# Patient Record
Sex: Male | Born: 1960 | Race: White | Hispanic: No | Marital: Married | Smoking: Former smoker
Health system: Southern US, Community
[De-identification: ages and names within clinical notes are randomized; demographics above are authoritative.]

## PROBLEM LIST (undated history)

## (undated) DIAGNOSIS — Z9221 Personal history of antineoplastic chemotherapy: Secondary | ICD-10-CM

## (undated) DIAGNOSIS — R682 Dry mouth, unspecified: Secondary | ICD-10-CM

## (undated) DIAGNOSIS — Z923 Personal history of irradiation: Secondary | ICD-10-CM

## (undated) DIAGNOSIS — K117 Disturbances of salivary secretion: Secondary | ICD-10-CM

## (undated) DIAGNOSIS — D490 Neoplasm of unspecified behavior of digestive system: Secondary | ICD-10-CM

## (undated) DIAGNOSIS — E038 Other specified hypothyroidism: Secondary | ICD-10-CM

## (undated) DIAGNOSIS — E785 Hyperlipidemia, unspecified: Secondary | ICD-10-CM

## (undated) HISTORY — DX: Other specified hypothyroidism: E03.8

## (undated) HISTORY — DX: Neoplasm of unspecified behavior of digestive system: D49.0

## (undated) HISTORY — DX: Hyperlipidemia, unspecified: E78.5

---

## 2002-03-01 ENCOUNTER — Emergency Department (HOSPITAL_COMMUNITY): Admission: EM | Admit: 2002-03-01 | Discharge: 2002-03-02 | Payer: Self-pay | Admitting: *Deleted

## 2005-02-09 ENCOUNTER — Inpatient Hospital Stay (HOSPITAL_COMMUNITY): Admission: EM | Admit: 2005-02-09 | Discharge: 2005-02-14 | Payer: Self-pay | Admitting: Emergency Medicine

## 2006-03-02 ENCOUNTER — Inpatient Hospital Stay (HOSPITAL_COMMUNITY): Admission: EM | Admit: 2006-03-02 | Discharge: 2006-03-06 | Payer: Self-pay | Admitting: Emergency Medicine

## 2006-10-29 ENCOUNTER — Ambulatory Visit (HOSPITAL_COMMUNITY): Admission: RE | Admit: 2006-10-29 | Discharge: 2006-10-30 | Payer: Self-pay | Admitting: General Surgery

## 2008-05-07 ENCOUNTER — Inpatient Hospital Stay (HOSPITAL_COMMUNITY): Admission: AD | Admit: 2008-05-07 | Discharge: 2008-05-09 | Payer: Self-pay | Admitting: Internal Medicine

## 2009-04-18 ENCOUNTER — Encounter: Admission: RE | Admit: 2009-04-18 | Discharge: 2009-04-18 | Payer: Self-pay | Admitting: Otolaryngology

## 2009-05-16 ENCOUNTER — Ambulatory Visit (HOSPITAL_COMMUNITY): Admission: RE | Admit: 2009-05-16 | Discharge: 2009-05-16 | Payer: Self-pay | Admitting: Otolaryngology

## 2009-05-16 ENCOUNTER — Encounter (INDEPENDENT_AMBULATORY_CARE_PROVIDER_SITE_OTHER): Payer: Self-pay | Admitting: Otolaryngology

## 2009-05-24 ENCOUNTER — Ambulatory Visit (HOSPITAL_COMMUNITY): Admission: RE | Admit: 2009-05-24 | Discharge: 2009-05-24 | Payer: Self-pay | Admitting: Otolaryngology

## 2009-05-29 ENCOUNTER — Ambulatory Visit: Payer: Self-pay | Admitting: Oncology

## 2009-06-07 LAB — COMPREHENSIVE METABOLIC PANEL
ALT: 76 U/L — ABNORMAL HIGH (ref 0–53)
AST: 34 U/L (ref 0–37)
Albumin: 4.6 g/dL (ref 3.5–5.2)
Alkaline Phosphatase: 81 U/L (ref 39–117)
BUN: 11 mg/dL (ref 6–23)
CO2: 28 mEq/L (ref 19–32)
Calcium: 9.8 mg/dL (ref 8.4–10.5)
Chloride: 103 mEq/L (ref 96–112)
Creatinine, Ser: 1.03 mg/dL (ref 0.40–1.50)
Glucose, Bld: 91 mg/dL (ref 70–99)
Potassium: 4.8 mEq/L (ref 3.5–5.3)
Sodium: 139 mEq/L (ref 135–145)
Total Bilirubin: 0.7 mg/dL (ref 0.3–1.2)
Total Protein: 7.2 g/dL (ref 6.0–8.3)

## 2009-06-07 LAB — CBC WITH DIFFERENTIAL/PLATELET
BASO%: 0.5 % (ref 0.0–2.0)
Basophils Absolute: 0 10*3/uL (ref 0.0–0.1)
EOS%: 2.2 % (ref 0.0–7.0)
Eosinophils Absolute: 0.2 10*3/uL (ref 0.0–0.5)
HCT: 47.1 % (ref 38.4–49.9)
HGB: 15.9 g/dL (ref 13.0–17.1)
LYMPH%: 31.1 % (ref 14.0–49.0)
MCH: 32.5 pg (ref 27.2–33.4)
MCHC: 33.8 g/dL (ref 32.0–36.0)
MCV: 96.2 fL (ref 79.3–98.0)
MONO#: 0.7 10*3/uL (ref 0.1–0.9)
MONO%: 7.6 % (ref 0.0–14.0)
NEUT#: 5.1 10*3/uL (ref 1.5–6.5)
NEUT%: 58.6 % (ref 39.0–75.0)
Platelets: 236 10*3/uL (ref 140–400)
RBC: 4.89 10*6/uL (ref 4.20–5.82)
RDW: 13.1 % (ref 11.0–14.6)
WBC: 8.6 10*3/uL (ref 4.0–10.3)
lymph#: 2.7 10*3/uL (ref 0.9–3.3)

## 2009-06-11 ENCOUNTER — Ambulatory Visit: Admission: RE | Admit: 2009-06-11 | Discharge: 2009-07-12 | Payer: Self-pay | Admitting: Radiation Oncology

## 2009-06-11 ENCOUNTER — Ambulatory Visit: Payer: Self-pay | Admitting: Dentistry

## 2009-06-11 ENCOUNTER — Encounter: Admission: AD | Admit: 2009-06-11 | Discharge: 2009-06-11 | Payer: Self-pay | Admitting: Dentistry

## 2009-06-13 ENCOUNTER — Ambulatory Visit: Payer: Self-pay | Admitting: Dentistry

## 2009-06-25 ENCOUNTER — Ambulatory Visit (HOSPITAL_COMMUNITY): Admission: RE | Admit: 2009-06-25 | Discharge: 2009-06-25 | Payer: Self-pay | Admitting: Radiation Oncology

## 2009-07-04 ENCOUNTER — Ambulatory Visit (HOSPITAL_COMMUNITY): Admission: RE | Admit: 2009-07-04 | Discharge: 2009-07-04 | Payer: Self-pay | Admitting: Oncology

## 2009-07-10 ENCOUNTER — Ambulatory Visit: Payer: Self-pay | Admitting: Oncology

## 2009-07-15 ENCOUNTER — Ambulatory Visit: Admission: RE | Admit: 2009-07-15 | Discharge: 2009-09-04 | Payer: Self-pay | Admitting: Radiation Oncology

## 2009-07-15 LAB — CBC WITH DIFFERENTIAL/PLATELET
BASO%: 0.7 % (ref 0.0–2.0)
Basophils Absolute: 0.1 10*3/uL (ref 0.0–0.1)
EOS%: 3.1 % (ref 0.0–7.0)
Eosinophils Absolute: 0.2 10*3/uL (ref 0.0–0.5)
HCT: 42.8 % (ref 38.4–49.9)
HGB: 14.9 g/dL (ref 13.0–17.1)
LYMPH%: 33 % (ref 14.0–49.0)
MCH: 32 pg (ref 27.2–33.4)
MCHC: 34.8 g/dL (ref 32.0–36.0)
MCV: 92 fL (ref 79.3–98.0)
MONO#: 0.7 10*3/uL (ref 0.1–0.9)
MONO%: 9.7 % (ref 0.0–14.0)
NEUT#: 3.8 10*3/uL (ref 1.5–6.5)
NEUT%: 53.5 % (ref 39.0–75.0)
Platelets: 222 10*3/uL (ref 140–400)
RBC: 4.65 10*6/uL (ref 4.20–5.82)
RDW: 12.5 % (ref 11.0–14.6)
WBC: 7.1 10*3/uL (ref 4.0–10.3)
lymph#: 2.4 10*3/uL (ref 0.9–3.3)

## 2009-07-15 LAB — COMPREHENSIVE METABOLIC PANEL
ALT: 79 U/L — ABNORMAL HIGH (ref 0–53)
AST: 47 U/L — ABNORMAL HIGH (ref 0–37)
Albumin: 3.7 g/dL (ref 3.5–5.2)
Alkaline Phosphatase: 83 U/L (ref 39–117)
BUN: 11 mg/dL (ref 6–23)
CO2: 29 mEq/L (ref 19–32)
Calcium: 9.1 mg/dL (ref 8.4–10.5)
Chloride: 102 mEq/L (ref 96–112)
Creatinine, Ser: 0.98 mg/dL (ref 0.40–1.50)
Glucose, Bld: 94 mg/dL (ref 70–99)
Potassium: 4.1 mEq/L (ref 3.5–5.3)
Sodium: 136 mEq/L (ref 135–145)
Total Bilirubin: 1 mg/dL (ref 0.3–1.2)
Total Protein: 6.8 g/dL (ref 6.0–8.3)

## 2009-07-22 ENCOUNTER — Encounter: Admission: RE | Admit: 2009-07-22 | Discharge: 2009-10-20 | Payer: Self-pay | Admitting: Radiation Oncology

## 2009-07-22 LAB — BASIC METABOLIC PANEL
BUN: 14 mg/dL (ref 6–23)
CO2: 26 mEq/L (ref 19–32)
Calcium: 9.4 mg/dL (ref 8.4–10.5)
Chloride: 99 mEq/L (ref 96–112)
Creatinine, Ser: 1.15 mg/dL (ref 0.40–1.50)
Glucose, Bld: 90 mg/dL (ref 70–99)
Potassium: 4.8 mEq/L (ref 3.5–5.3)
Sodium: 133 mEq/L — ABNORMAL LOW (ref 135–145)

## 2009-07-29 LAB — CBC WITH DIFFERENTIAL/PLATELET
BASO%: 0.4 % (ref 0.0–2.0)
Basophils Absolute: 0 10*3/uL (ref 0.0–0.1)
EOS%: 2 % (ref 0.0–7.0)
Eosinophils Absolute: 0.2 10*3/uL (ref 0.0–0.5)
HCT: 42.3 % (ref 38.4–49.9)
HGB: 14.7 g/dL (ref 13.0–17.1)
LYMPH%: 12.1 % — ABNORMAL LOW (ref 14.0–49.0)
MCH: 32 pg (ref 27.2–33.4)
MCHC: 34.8 g/dL (ref 32.0–36.0)
MCV: 92 fL (ref 79.3–98.0)
MONO#: 0.8 10*3/uL (ref 0.1–0.9)
MONO%: 10.3 % (ref 0.0–14.0)
NEUT#: 6.1 10*3/uL (ref 1.5–6.5)
NEUT%: 75.2 % — ABNORMAL HIGH (ref 39.0–75.0)
Platelets: 172 10*3/uL (ref 140–400)
RBC: 4.6 10*6/uL (ref 4.20–5.82)
RDW: 12.4 % (ref 11.0–14.6)
WBC: 8.1 10*3/uL (ref 4.0–10.3)
lymph#: 1 10*3/uL (ref 0.9–3.3)
nRBC: 0 % (ref 0–0)

## 2009-07-29 LAB — BASIC METABOLIC PANEL
BUN: 16 mg/dL (ref 6–23)
CO2: 24 mEq/L (ref 19–32)
Calcium: 9 mg/dL (ref 8.4–10.5)
Chloride: 102 mEq/L (ref 96–112)
Creatinine, Ser: 0.93 mg/dL (ref 0.40–1.50)
Glucose, Bld: 78 mg/dL (ref 70–99)
Potassium: 4.4 mEq/L (ref 3.5–5.3)
Sodium: 136 mEq/L (ref 135–145)

## 2009-08-05 ENCOUNTER — Emergency Department (HOSPITAL_COMMUNITY): Admission: EM | Admit: 2009-08-05 | Discharge: 2009-08-05 | Payer: Self-pay | Admitting: Emergency Medicine

## 2009-08-05 LAB — COMPREHENSIVE METABOLIC PANEL
ALT: 36 U/L (ref 0–53)
AST: 25 U/L (ref 0–37)
Albumin: 3.4 g/dL — ABNORMAL LOW (ref 3.5–5.2)
Alkaline Phosphatase: 100 U/L (ref 39–117)
BUN: 12 mg/dL (ref 6–23)
CO2: 28 mEq/L (ref 19–32)
Calcium: 8.9 mg/dL (ref 8.4–10.5)
Chloride: 98 mEq/L (ref 96–112)
Creatinine, Ser: 1.04 mg/dL (ref 0.40–1.50)
Glucose, Bld: 96 mg/dL (ref 70–99)
Potassium: 4.2 mEq/L (ref 3.5–5.3)
Sodium: 133 mEq/L — ABNORMAL LOW (ref 135–145)
Total Bilirubin: 0.6 mg/dL (ref 0.3–1.2)
Total Protein: 7 g/dL (ref 6.0–8.3)

## 2009-08-05 LAB — CBC WITH DIFFERENTIAL/PLATELET
BASO%: 1.3 % (ref 0.0–2.0)
Basophils Absolute: 0 10*3/uL (ref 0.0–0.1)
EOS%: 2.5 % (ref 0.0–7.0)
Eosinophils Absolute: 0.1 10*3/uL (ref 0.0–0.5)
HCT: 38.3 % — ABNORMAL LOW (ref 38.4–49.9)
HGB: 13 g/dL (ref 13.0–17.1)
LYMPH%: 15.9 % (ref 14.0–49.0)
MCH: 31.6 pg (ref 27.2–33.4)
MCHC: 33.9 g/dL (ref 32.0–36.0)
MCV: 93.2 fL (ref 79.3–98.0)
MONO#: 1.1 10*3/uL — ABNORMAL HIGH (ref 0.1–0.9)
MONO%: 36.2 % — ABNORMAL HIGH (ref 0.0–14.0)
NEUT#: 1.4 10*3/uL — ABNORMAL LOW (ref 1.5–6.5)
NEUT%: 44.1 % (ref 39.0–75.0)
Platelets: 236 10*3/uL (ref 140–400)
RBC: 4.11 10*6/uL — ABNORMAL LOW (ref 4.20–5.82)
RDW: 12.5 % (ref 11.0–14.6)
WBC: 3.2 10*3/uL — ABNORMAL LOW (ref 4.0–10.3)
lymph#: 0.5 10*3/uL — ABNORMAL LOW (ref 0.9–3.3)

## 2009-08-05 LAB — MAGNESIUM: Magnesium: 2 mg/dL (ref 1.5–2.5)

## 2009-08-06 ENCOUNTER — Ambulatory Visit: Payer: Self-pay | Admitting: Oncology

## 2009-08-07 ENCOUNTER — Ambulatory Visit: Payer: Self-pay | Admitting: Dentistry

## 2009-08-08 LAB — COMPREHENSIVE METABOLIC PANEL
ALT: 45 U/L (ref 0–53)
AST: 31 U/L (ref 0–37)
Albumin: 3.3 g/dL — ABNORMAL LOW (ref 3.5–5.2)
Alkaline Phosphatase: 104 U/L (ref 39–117)
BUN: 13 mg/dL (ref 6–23)
CO2: 29 mEq/L (ref 19–32)
Calcium: 9.3 mg/dL (ref 8.4–10.5)
Chloride: 99 mEq/L (ref 96–112)
Creatinine, Ser: 0.97 mg/dL (ref 0.40–1.50)
Glucose, Bld: 104 mg/dL — ABNORMAL HIGH (ref 70–99)
Potassium: 4.3 mEq/L (ref 3.5–5.3)
Sodium: 135 mEq/L (ref 135–145)
Total Bilirubin: 0.4 mg/dL (ref 0.3–1.2)
Total Protein: 7.1 g/dL (ref 6.0–8.3)

## 2009-08-08 LAB — CBC WITH DIFFERENTIAL/PLATELET
BASO%: 1.5 % (ref 0.0–2.0)
Basophils Absolute: 0.1 10*3/uL (ref 0.0–0.1)
EOS%: 5.2 % (ref 0.0–7.0)
Eosinophils Absolute: 0.2 10*3/uL (ref 0.0–0.5)
HCT: 39.8 % (ref 38.4–49.9)
HGB: 13.5 g/dL (ref 13.0–17.1)
LYMPH%: 17.6 % (ref 14.0–49.0)
MCH: 31.7 pg (ref 27.2–33.4)
MCHC: 33.9 g/dL (ref 32.0–36.0)
MCV: 93.4 fL (ref 79.3–98.0)
MONO#: 0.9 10*3/uL (ref 0.1–0.9)
MONO%: 28.1 % — ABNORMAL HIGH (ref 0.0–14.0)
NEUT#: 1.5 10*3/uL (ref 1.5–6.5)
NEUT%: 47.6 % (ref 39.0–75.0)
Platelets: 232 10*3/uL (ref 140–400)
RBC: 4.26 10*6/uL (ref 4.20–5.82)
RDW: 12.4 % (ref 11.0–14.6)
WBC: 3.2 10*3/uL — ABNORMAL LOW (ref 4.0–10.3)
lymph#: 0.6 10*3/uL — ABNORMAL LOW (ref 0.9–3.3)
nRBC: 0 % (ref 0–0)

## 2009-08-15 LAB — COMPREHENSIVE METABOLIC PANEL
ALT: 71 U/L — ABNORMAL HIGH (ref 0–53)
AST: 38 U/L — ABNORMAL HIGH (ref 0–37)
Albumin: 3.2 g/dL — ABNORMAL LOW (ref 3.5–5.2)
Alkaline Phosphatase: 95 U/L (ref 39–117)
BUN: 20 mg/dL (ref 6–23)
CO2: 29 mEq/L (ref 19–32)
Calcium: 8.6 mg/dL (ref 8.4–10.5)
Chloride: 100 mEq/L (ref 96–112)
Creatinine, Ser: 1.19 mg/dL (ref 0.40–1.50)
Glucose, Bld: 82 mg/dL (ref 70–99)
Potassium: 5 mEq/L (ref 3.5–5.3)
Sodium: 133 mEq/L — ABNORMAL LOW (ref 135–145)
Total Bilirubin: 1 mg/dL (ref 0.3–1.2)
Total Protein: 6.1 g/dL (ref 6.0–8.3)

## 2009-08-15 LAB — CBC WITH DIFFERENTIAL/PLATELET
BASO%: 2.4 % — ABNORMAL HIGH (ref 0.0–2.0)
Basophils Absolute: 0.1 10*3/uL (ref 0.0–0.1)
EOS%: 0.6 % (ref 0.0–7.0)
Eosinophils Absolute: 0 10*3/uL (ref 0.0–0.5)
HCT: 39.4 % (ref 38.4–49.9)
HGB: 13.7 g/dL (ref 13.0–17.1)
LYMPH%: 11.9 % — ABNORMAL LOW (ref 14.0–49.0)
MCH: 31.7 pg (ref 27.2–33.4)
MCHC: 34.8 g/dL (ref 32.0–36.0)
MCV: 91.2 fL (ref 79.3–98.0)
MONO#: 0.4 10*3/uL (ref 0.1–0.9)
MONO%: 12.2 % (ref 0.0–14.0)
NEUT#: 2.4 10*3/uL (ref 1.5–6.5)
NEUT%: 72.9 % (ref 39.0–75.0)
Platelets: 136 10*3/uL — ABNORMAL LOW (ref 140–400)
RBC: 4.32 10*6/uL (ref 4.20–5.82)
RDW: 12 % (ref 11.0–14.6)
WBC: 3.3 10*3/uL — ABNORMAL LOW (ref 4.0–10.3)
lymph#: 0.4 10*3/uL — ABNORMAL LOW (ref 0.9–3.3)
nRBC: 0 % (ref 0–0)

## 2009-08-22 LAB — CBC WITH DIFFERENTIAL/PLATELET
BASO%: 0.7 % (ref 0.0–2.0)
Basophils Absolute: 0 10*3/uL (ref 0.0–0.1)
EOS%: 1.6 % (ref 0.0–7.0)
Eosinophils Absolute: 0.1 10*3/uL (ref 0.0–0.5)
HCT: 36.7 % — ABNORMAL LOW (ref 38.4–49.9)
HGB: 12.5 g/dL — ABNORMAL LOW (ref 13.0–17.1)
LYMPH%: 14.5 % (ref 14.0–49.0)
MCH: 31.4 pg (ref 27.2–33.4)
MCHC: 34.1 g/dL (ref 32.0–36.0)
MCV: 92.2 fL (ref 79.3–98.0)
MONO#: 0.4 10*3/uL (ref 0.1–0.9)
MONO%: 11.5 % (ref 0.0–14.0)
NEUT#: 2.2 10*3/uL (ref 1.5–6.5)
NEUT%: 71.7 % (ref 39.0–75.0)
Platelets: 128 10*3/uL — ABNORMAL LOW (ref 140–400)
RBC: 3.98 10*6/uL — ABNORMAL LOW (ref 4.20–5.82)
RDW: 12.1 % (ref 11.0–14.6)
WBC: 3 10*3/uL — ABNORMAL LOW (ref 4.0–10.3)
lymph#: 0.4 10*3/uL — ABNORMAL LOW (ref 0.9–3.3)

## 2009-08-22 LAB — COMPREHENSIVE METABOLIC PANEL
ALT: 21 U/L (ref 0–53)
AST: 18 U/L (ref 0–37)
Albumin: 3.8 g/dL (ref 3.5–5.2)
Alkaline Phosphatase: 87 U/L (ref 39–117)
BUN: 20 mg/dL (ref 6–23)
CO2: 27 mEq/L (ref 19–32)
Calcium: 8.7 mg/dL (ref 8.4–10.5)
Chloride: 100 mEq/L (ref 96–112)
Creatinine, Ser: 0.99 mg/dL (ref 0.40–1.50)
Glucose, Bld: 107 mg/dL — ABNORMAL HIGH (ref 70–99)
Potassium: 4.6 mEq/L (ref 3.5–5.3)
Sodium: 134 mEq/L — ABNORMAL LOW (ref 135–145)
Total Bilirubin: 0.4 mg/dL (ref 0.3–1.2)
Total Protein: 6.4 g/dL (ref 6.0–8.3)

## 2009-08-29 LAB — CBC WITH DIFFERENTIAL/PLATELET
BASO%: 0.9 % (ref 0.0–2.0)
Basophils Absolute: 0 10*3/uL (ref 0.0–0.1)
EOS%: 4.1 % (ref 0.0–7.0)
Eosinophils Absolute: 0.1 10*3/uL (ref 0.0–0.5)
HCT: 31.8 % — ABNORMAL LOW (ref 38.4–49.9)
HGB: 10.5 g/dL — ABNORMAL LOW (ref 13.0–17.1)
LYMPH%: 21.5 % (ref 14.0–49.0)
MCH: 30.9 pg (ref 27.2–33.4)
MCHC: 33 g/dL (ref 32.0–36.0)
MCV: 93.5 fL (ref 79.3–98.0)
MONO#: 1 10*3/uL — ABNORMAL HIGH (ref 0.1–0.9)
MONO%: 47 % — ABNORMAL HIGH (ref 0.0–14.0)
NEUT#: 0.6 10*3/uL — ABNORMAL LOW (ref 1.5–6.5)
NEUT%: 26.5 % — ABNORMAL LOW (ref 39.0–75.0)
Platelets: 315 10*3/uL (ref 140–400)
RBC: 3.4 10*6/uL — ABNORMAL LOW (ref 4.20–5.82)
RDW: 12.9 % (ref 11.0–14.6)
WBC: 2.2 10*3/uL — ABNORMAL LOW (ref 4.0–10.3)
lymph#: 0.5 10*3/uL — ABNORMAL LOW (ref 0.9–3.3)

## 2009-09-26 ENCOUNTER — Ambulatory Visit: Payer: Self-pay | Admitting: Oncology

## 2009-09-27 ENCOUNTER — Ambulatory Visit (HOSPITAL_COMMUNITY): Admission: RE | Admit: 2009-09-27 | Discharge: 2009-09-27 | Payer: Self-pay | Admitting: Oncology

## 2009-09-30 ENCOUNTER — Ambulatory Visit: Payer: Self-pay | Admitting: Oncology

## 2009-09-30 LAB — COMPREHENSIVE METABOLIC PANEL
ALT: 27 U/L (ref 0–53)
AST: 20 U/L (ref 0–37)
Albumin: 3.9 g/dL (ref 3.5–5.2)
Alkaline Phosphatase: 75 U/L (ref 39–117)
BUN: 10 mg/dL (ref 6–23)
CO2: 29 mEq/L (ref 19–32)
Calcium: 9.7 mg/dL (ref 8.4–10.5)
Chloride: 101 mEq/L (ref 96–112)
Creatinine, Ser: 1.03 mg/dL (ref 0.40–1.50)
Glucose, Bld: 82 mg/dL (ref 70–99)
Potassium: 4.5 mEq/L (ref 3.5–5.3)
Sodium: 138 mEq/L (ref 135–145)
Total Bilirubin: 0.5 mg/dL (ref 0.3–1.2)
Total Protein: 6.4 g/dL (ref 6.0–8.3)

## 2009-09-30 LAB — CBC WITH DIFFERENTIAL/PLATELET
BASO%: 0.7 % (ref 0.0–2.0)
Basophils Absolute: 0 10*3/uL (ref 0.0–0.1)
EOS%: 2 % (ref 0.0–7.0)
Eosinophils Absolute: 0.1 10*3/uL (ref 0.0–0.5)
HCT: 34.8 % — ABNORMAL LOW (ref 38.4–49.9)
HGB: 12.1 g/dL — ABNORMAL LOW (ref 13.0–17.1)
LYMPH%: 6.8 % — ABNORMAL LOW (ref 14.0–49.0)
MCH: 33.5 pg — ABNORMAL HIGH (ref 27.2–33.4)
MCHC: 34.9 g/dL (ref 32.0–36.0)
MCV: 96.3 fL (ref 79.3–98.0)
MONO#: 0.8 10*3/uL (ref 0.1–0.9)
MONO%: 13.4 % (ref 0.0–14.0)
NEUT#: 4.6 10*3/uL (ref 1.5–6.5)
NEUT%: 77.1 % — ABNORMAL HIGH (ref 39.0–75.0)
Platelets: 248 10*3/uL (ref 140–400)
RBC: 3.61 10*6/uL — ABNORMAL LOW (ref 4.20–5.82)
RDW: 15 % — ABNORMAL HIGH (ref 11.0–14.6)
WBC: 6 10*3/uL (ref 4.0–10.3)
lymph#: 0.4 10*3/uL — ABNORMAL LOW (ref 0.9–3.3)

## 2009-10-02 ENCOUNTER — Ambulatory Visit: Payer: Self-pay | Admitting: Dentistry

## 2009-11-06 ENCOUNTER — Ambulatory Visit: Payer: Self-pay | Admitting: Oncology

## 2009-11-07 ENCOUNTER — Encounter: Admission: RE | Admit: 2009-11-07 | Discharge: 2010-02-05 | Payer: Self-pay | Admitting: Radiation Oncology

## 2009-12-03 ENCOUNTER — Ambulatory Visit (HOSPITAL_COMMUNITY): Admission: RE | Admit: 2009-12-03 | Discharge: 2009-12-03 | Payer: Self-pay | Admitting: Oncology

## 2009-12-03 LAB — COMPREHENSIVE METABOLIC PANEL
ALT: 20 U/L (ref 0–53)
AST: 17 U/L (ref 0–37)
Albumin: 4.2 g/dL (ref 3.5–5.2)
Alkaline Phosphatase: 65 U/L (ref 39–117)
BUN: 11 mg/dL (ref 6–23)
CO2: 22 mEq/L (ref 19–32)
Calcium: 9.5 mg/dL (ref 8.4–10.5)
Chloride: 104 mEq/L (ref 96–112)
Creatinine, Ser: 0.89 mg/dL (ref 0.40–1.50)
Glucose, Bld: 91 mg/dL (ref 70–99)
Potassium: 4.3 mEq/L (ref 3.5–5.3)
Sodium: 137 mEq/L (ref 135–145)
Total Bilirubin: 0.4 mg/dL (ref 0.3–1.2)
Total Protein: 6.9 g/dL (ref 6.0–8.3)

## 2009-12-03 LAB — CBC WITH DIFFERENTIAL/PLATELET
BASO%: 0.4 % (ref 0.0–2.0)
Basophils Absolute: 0 10*3/uL (ref 0.0–0.1)
EOS%: 1.6 % (ref 0.0–7.0)
Eosinophils Absolute: 0.1 10*3/uL (ref 0.0–0.5)
HCT: 38.8 % (ref 38.4–49.9)
HGB: 13.5 g/dL (ref 13.0–17.1)
LYMPH%: 13.2 % — ABNORMAL LOW (ref 14.0–49.0)
MCH: 33.2 pg (ref 27.2–33.4)
MCHC: 34.7 g/dL (ref 32.0–36.0)
MCV: 95.6 fL (ref 79.3–98.0)
MONO#: 0.4 10*3/uL (ref 0.1–0.9)
MONO%: 9.1 % (ref 0.0–14.0)
NEUT#: 3.1 10*3/uL (ref 1.5–6.5)
NEUT%: 75.7 % — ABNORMAL HIGH (ref 39.0–75.0)
Platelets: 210 10*3/uL (ref 140–400)
RBC: 4.06 10*6/uL — ABNORMAL LOW (ref 4.20–5.82)
RDW: 12.8 % (ref 11.0–14.6)
WBC: 4.1 10*3/uL (ref 4.0–10.3)
lymph#: 0.5 10*3/uL — ABNORMAL LOW (ref 0.9–3.3)

## 2009-12-03 LAB — TSH: TSH: 1.707 u[IU]/mL (ref 0.350–4.500)

## 2009-12-13 ENCOUNTER — Ambulatory Visit (HOSPITAL_COMMUNITY): Admission: RE | Admit: 2009-12-13 | Discharge: 2009-12-13 | Payer: Self-pay | Admitting: Oncology

## 2010-04-16 ENCOUNTER — Ambulatory Visit (HOSPITAL_COMMUNITY): Admission: RE | Admit: 2010-04-16 | Discharge: 2010-04-16 | Payer: Self-pay | Admitting: Oncology

## 2010-04-17 ENCOUNTER — Ambulatory Visit: Payer: Self-pay | Admitting: Oncology

## 2010-04-21 LAB — CBC WITH DIFFERENTIAL/PLATELET
BASO%: 0.1 % (ref 0.0–2.0)
Basophils Absolute: 0 10*3/uL (ref 0.0–0.1)
EOS%: 1.4 % (ref 0.0–7.0)
Eosinophils Absolute: 0.1 10*3/uL (ref 0.0–0.5)
HCT: 41.9 % (ref 38.4–49.9)
HGB: 14.5 g/dL (ref 13.0–17.1)
LYMPH%: 11.3 % — ABNORMAL LOW (ref 14.0–49.0)
MCH: 33.2 pg (ref 27.2–33.4)
MCHC: 34.6 g/dL (ref 32.0–36.0)
MCV: 95.8 fL (ref 79.3–98.0)
MONO#: 0.5 10*3/uL (ref 0.1–0.9)
MONO%: 8.3 % (ref 0.0–14.0)
NEUT#: 4.3 10*3/uL (ref 1.5–6.5)
NEUT%: 78.9 % — ABNORMAL HIGH (ref 39.0–75.0)
Platelets: 224 10*3/uL (ref 140–400)
RBC: 4.38 10*6/uL (ref 4.20–5.82)
RDW: 13.6 % (ref 11.0–14.6)
WBC: 5.5 10*3/uL (ref 4.0–10.3)
lymph#: 0.6 10*3/uL — ABNORMAL LOW (ref 0.9–3.3)

## 2010-04-21 LAB — COMPREHENSIVE METABOLIC PANEL
ALT: 16 U/L (ref 0–53)
AST: 18 U/L (ref 0–37)
Albumin: 4.4 g/dL (ref 3.5–5.2)
Alkaline Phosphatase: 65 U/L (ref 39–117)
BUN: 8 mg/dL (ref 6–23)
CO2: 22 mEq/L (ref 19–32)
Calcium: 9.6 mg/dL (ref 8.4–10.5)
Chloride: 105 mEq/L (ref 96–112)
Creatinine, Ser: 1.04 mg/dL (ref 0.40–1.50)
Glucose, Bld: 85 mg/dL (ref 70–99)
Potassium: 3.9 mEq/L (ref 3.5–5.3)
Sodium: 142 mEq/L (ref 135–145)
Total Bilirubin: 0.9 mg/dL (ref 0.3–1.2)
Total Protein: 6.9 g/dL (ref 6.0–8.3)

## 2010-04-21 LAB — TSH: TSH: 14.335 u[IU]/mL — ABNORMAL HIGH (ref 0.350–4.500)

## 2010-04-24 LAB — T4, FREE: Free T4: 0.76 ng/dL — ABNORMAL LOW (ref 0.80–1.80)

## 2010-08-01 ENCOUNTER — Other Ambulatory Visit: Payer: Self-pay | Admitting: Oncology

## 2010-08-01 DIAGNOSIS — C099 Malignant neoplasm of tonsil, unspecified: Secondary | ICD-10-CM

## 2010-08-01 DIAGNOSIS — C029 Malignant neoplasm of tongue, unspecified: Secondary | ICD-10-CM

## 2010-08-28 IMAGING — CR DG CHEST 2V
2 series · 2 of 2 positions shown · non-contrast
Comparison: None available.

CLINICAL DATA: Cough and congestion.

CHEST - 2 VIEW

[w chest pa]
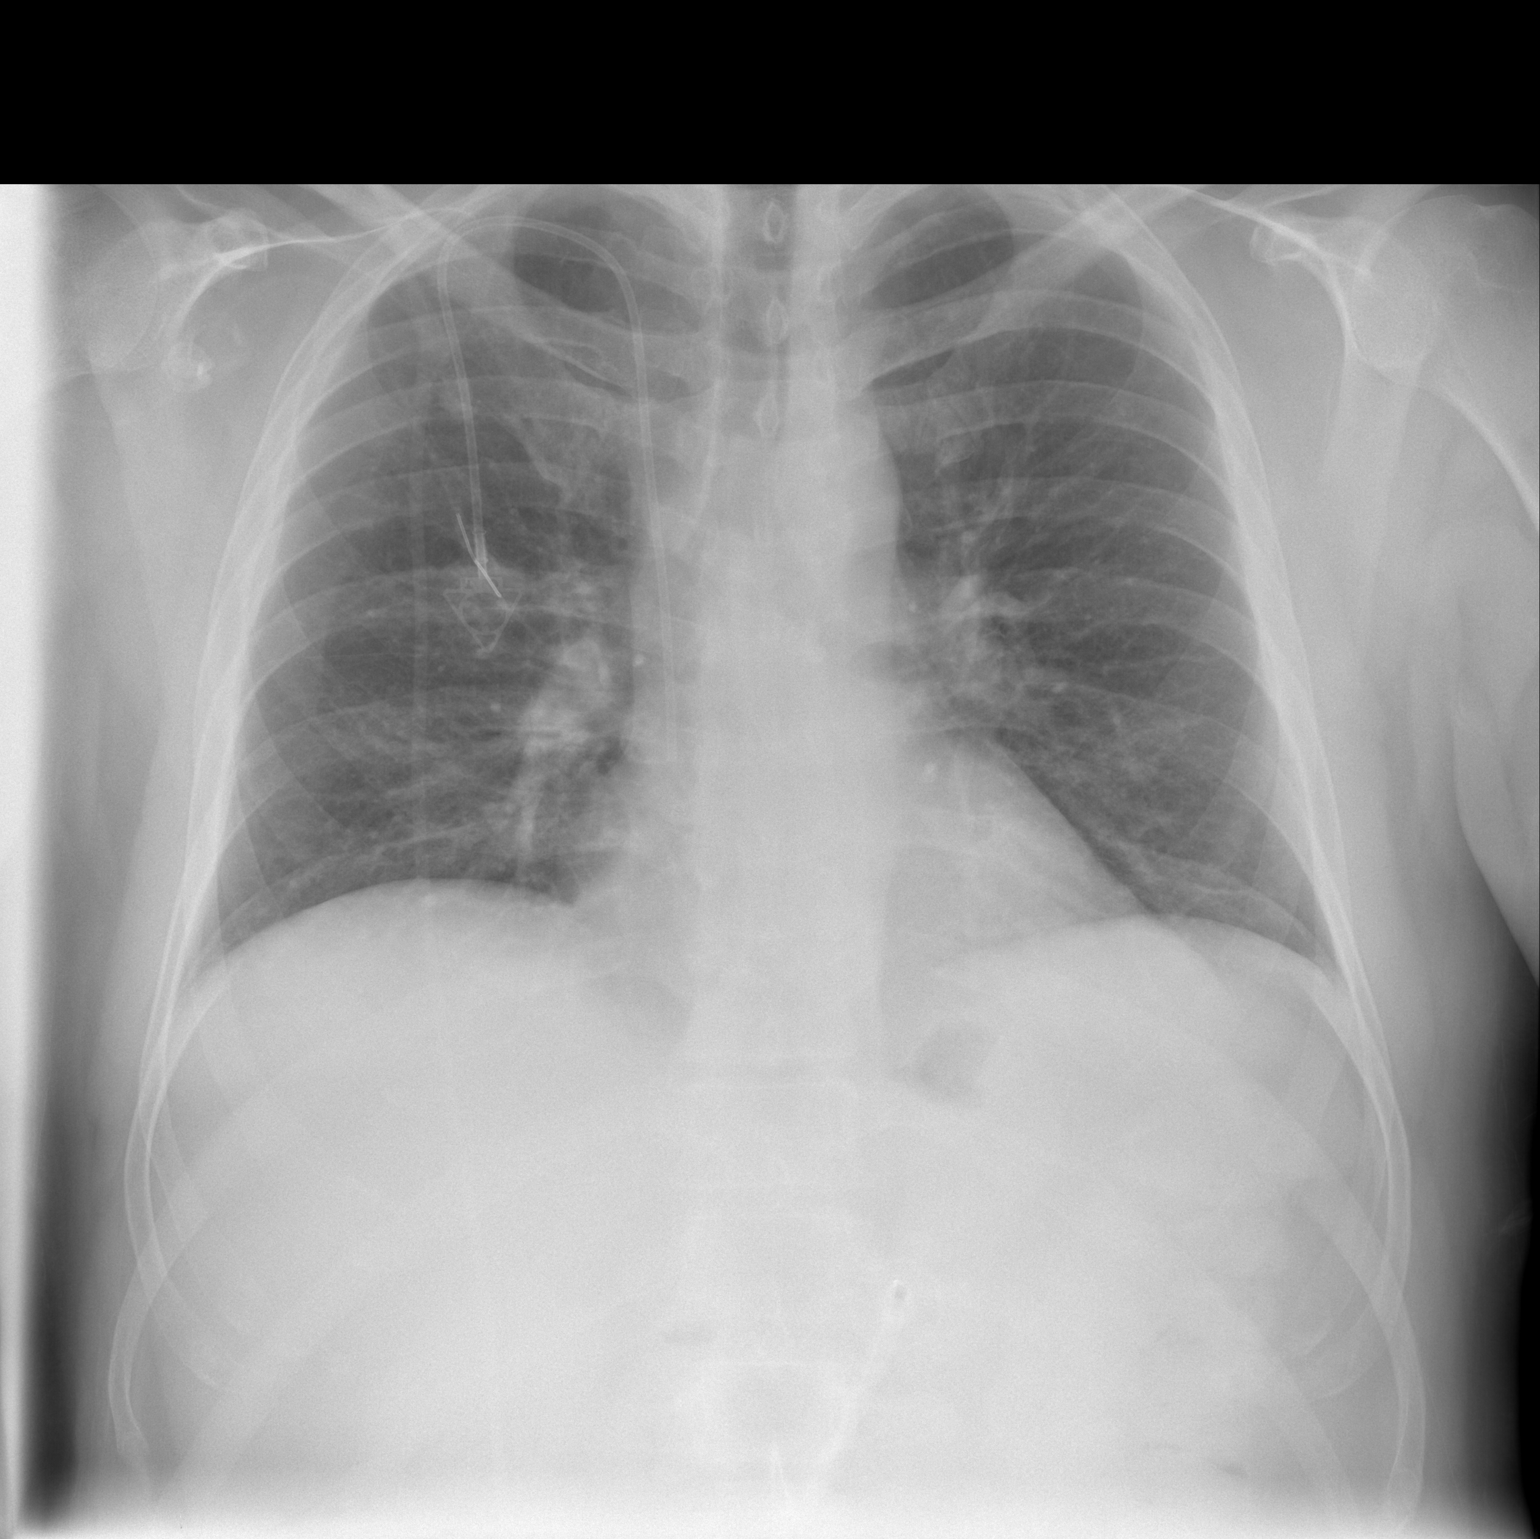

[w chest lat]
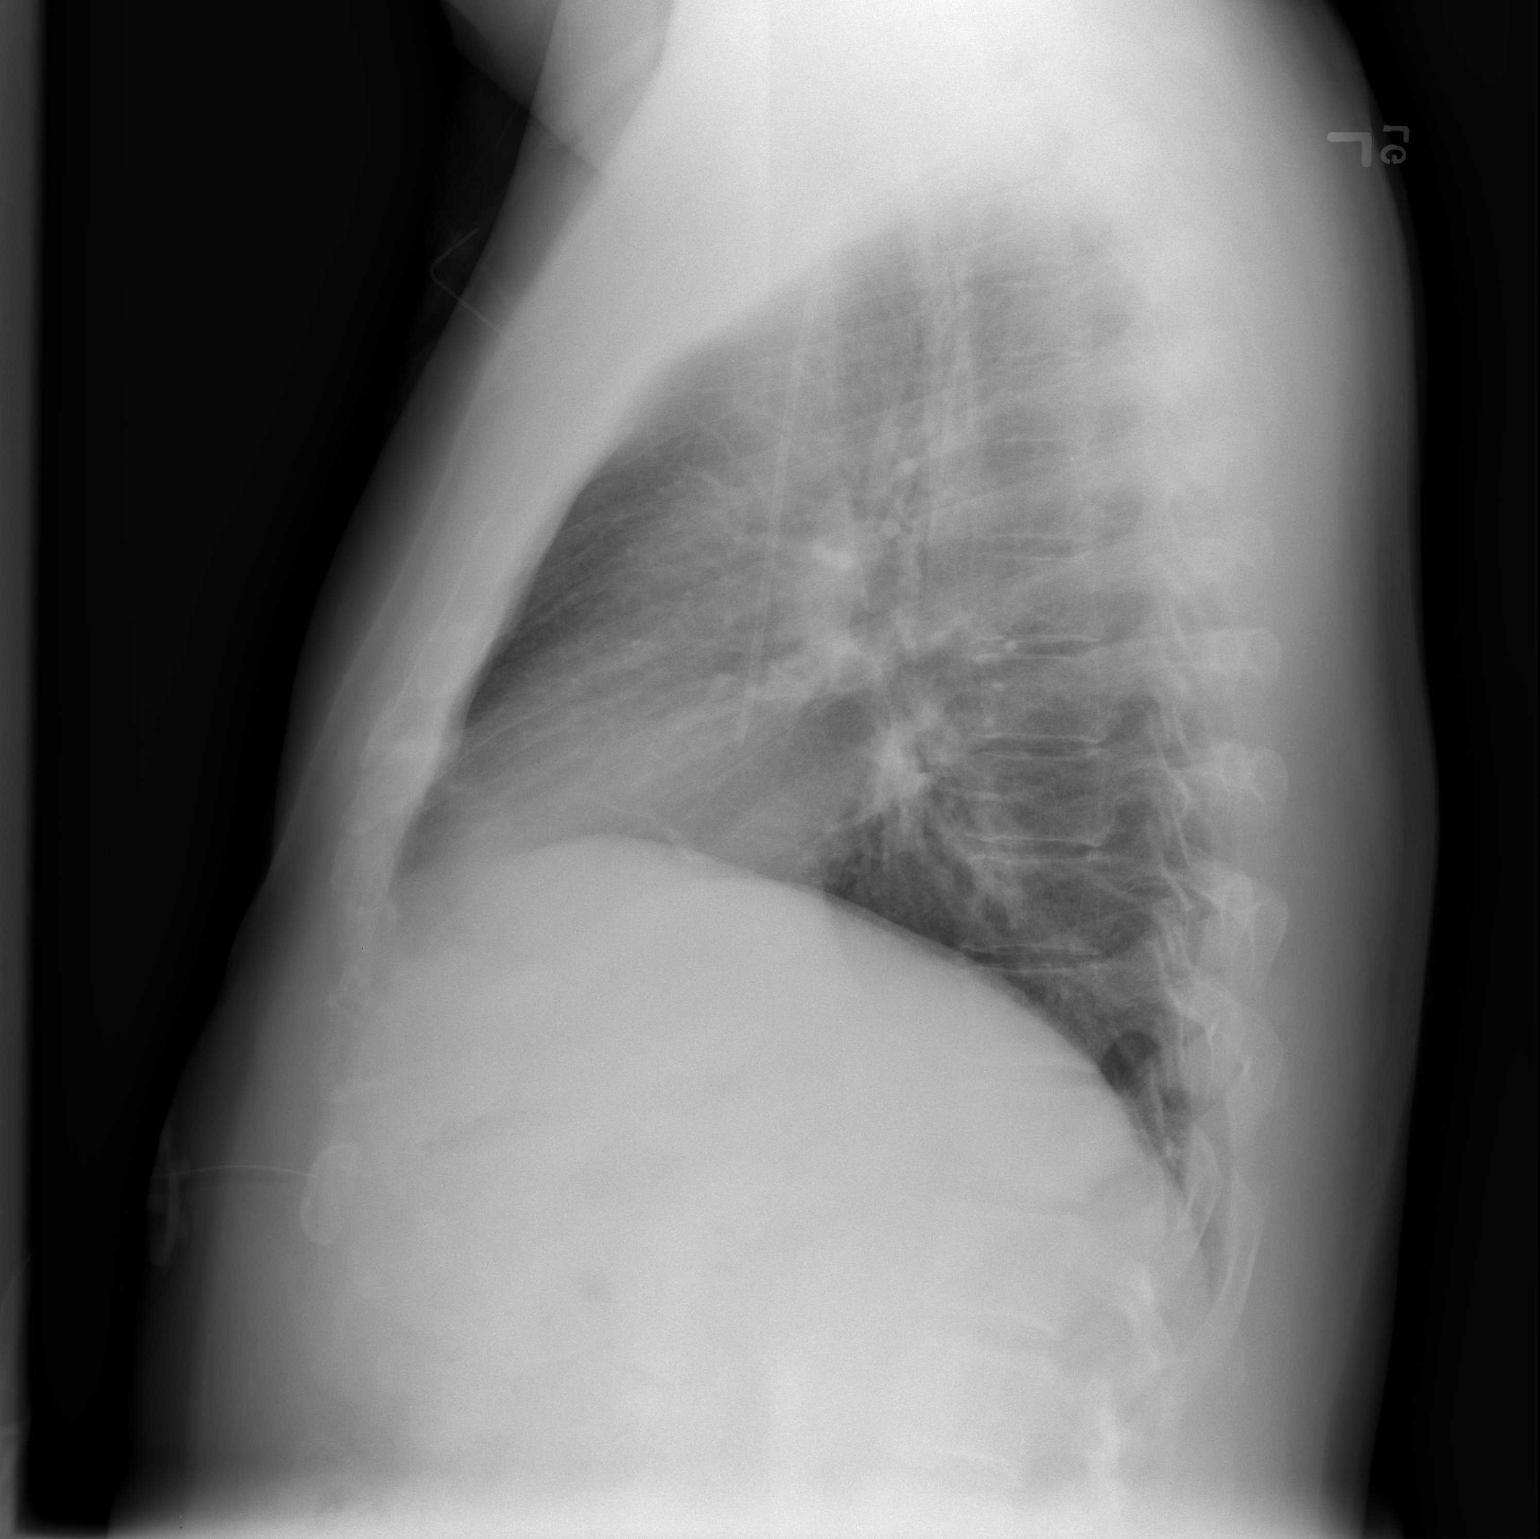

[2 of 2 positions shown; findings below may reference images not displayed]

FINDINGS: Lungs are clear.  Heart size normal.  No pleural effusion
or focal bony abnormality.
IMPRESSION: No acute disease.

## 2010-09-04 ENCOUNTER — Ambulatory Visit: Payer: Self-pay | Admitting: Radiation Oncology

## 2010-09-28 LAB — DIFFERENTIAL
Band Neutrophils: 0 % (ref 0–10)
Basophils Absolute: 0.1 10*3/uL (ref 0.0–0.1)
Basophils Relative: 2 % — ABNORMAL HIGH (ref 0–1)
Blasts: 0 %
Eosinophils Absolute: 0.2 10*3/uL (ref 0.0–0.7)
Eosinophils Relative: 4 % (ref 0–5)
Lymphocytes Relative: 13 % (ref 12–46)
Lymphs Abs: 0.5 10*3/uL — ABNORMAL LOW (ref 0.7–4.0)
Metamyelocytes Relative: 0 %
Monocytes Absolute: 1.1 10*3/uL — ABNORMAL HIGH (ref 0.1–1.0)
Monocytes Relative: 29 % — ABNORMAL HIGH (ref 3–12)
Myelocytes: 0 %
Neutro Abs: 2 10*3/uL (ref 1.7–7.7)
Neutrophils Relative %: 52 % (ref 43–77)
Promyelocytes Absolute: 0 %
nRBC: 0 /100 WBC

## 2010-09-28 LAB — URINALYSIS, ROUTINE W REFLEX MICROSCOPIC
Bilirubin Urine: NEGATIVE
Glucose, UA: NEGATIVE mg/dL
Hgb urine dipstick: NEGATIVE
Ketones, ur: NEGATIVE mg/dL
Leukocytes, UA: NEGATIVE
Nitrite: NEGATIVE
Protein, ur: 30 mg/dL — AB
Specific Gravity, Urine: 1.015 (ref 1.005–1.030)
Urobilinogen, UA: 0.2 mg/dL (ref 0.0–1.0)
pH: 8.5 — ABNORMAL HIGH (ref 5.0–8.0)

## 2010-09-28 LAB — COMPREHENSIVE METABOLIC PANEL
ALT: 39 U/L (ref 0–53)
AST: 30 U/L (ref 0–37)
Albumin: 3.7 g/dL (ref 3.5–5.2)
Alkaline Phosphatase: 102 U/L (ref 39–117)
BUN: 13 mg/dL (ref 6–23)
CO2: 30 mEq/L (ref 19–32)
Calcium: 8.9 mg/dL (ref 8.4–10.5)
Chloride: 92 mEq/L — ABNORMAL LOW (ref 96–112)
Creatinine, Ser: 1.15 mg/dL (ref 0.4–1.5)
GFR calc Af Amer: >60 mL/min (ref >60)
GFR calc non Af Amer: >60 mL/min (ref >60)
Glucose, Bld: 100 mg/dL — ABNORMAL HIGH (ref 70–99)
Potassium: 4.2 mEq/L (ref 3.5–5.1)
Sodium: 130 mEq/L — ABNORMAL LOW (ref 135–145)
Total Bilirubin: 0.5 mg/dL (ref 0.3–1.2)
Total Protein: 7.2 g/dL (ref 6.0–8.3)

## 2010-09-28 LAB — URINE CULTURE
Colony Count: NO GROWTH
Culture: NO GROWTH

## 2010-09-28 LAB — CBC
HCT: 38.5 % — ABNORMAL LOW (ref 39.0–52.0)
Hemoglobin: 13 g/dL (ref 13.0–17.0)
MCHC: 33.7 g/dL (ref 30.0–36.0)
MCV: 94.5 fL (ref 78.0–100.0)
Platelets: 251 10*3/uL (ref 150–400)
RBC: 4.07 MIL/uL — ABNORMAL LOW (ref 4.22–5.81)
RDW: 12.9 % (ref 11.5–15.5)
WBC: 3.9 10*3/uL — ABNORMAL LOW (ref 4.0–10.5)

## 2010-09-28 LAB — URINE MICROSCOPIC-ADD ON

## 2010-09-28 LAB — CULTURE, BLOOD (ROUTINE X 2)
Culture: NO GROWTH
Culture: NO GROWTH

## 2010-09-29 LAB — GLUCOSE, CAPILLARY: Glucose-Capillary: 108 mg/dL — ABNORMAL HIGH (ref 70–99)

## 2010-10-14 LAB — CBC
HCT: 44 % (ref 39.0–52.0)
Hemoglobin: 15.1 g/dL (ref 13.0–17.0)
MCHC: 34.2 g/dL (ref 30.0–36.0)
MCV: 94.8 fL (ref 78.0–100.0)
Platelets: 228 10*3/uL (ref 150–400)
RBC: 4.64 MIL/uL (ref 4.22–5.81)
RDW: 12.7 % (ref 11.5–15.5)
WBC: 5.9 10*3/uL (ref 4.0–10.5)

## 2010-10-15 LAB — CBC
HCT: 46.6 % (ref 39.0–52.0)
Hemoglobin: 16.3 g/dL (ref 13.0–17.0)
MCHC: 34.9 g/dL (ref 30.0–36.0)
MCV: 95.3 fL (ref 78.0–100.0)
Platelets: 205 10*3/uL (ref 150–400)
RBC: 4.89 MIL/uL (ref 4.22–5.81)
RDW: 12.9 % (ref 11.5–15.5)
WBC: 7 10*3/uL (ref 4.0–10.5)

## 2010-10-15 LAB — BASIC METABOLIC PANEL
BUN: 11 mg/dL (ref 6–23)
CO2: 26 mEq/L (ref 19–32)
Calcium: 9.4 mg/dL (ref 8.4–10.5)
Chloride: 106 mEq/L (ref 96–112)
Creatinine, Ser: 1.04 mg/dL (ref 0.4–1.5)
GFR calc Af Amer: >60 mL/min (ref >60)
GFR calc non Af Amer: >60 mL/min (ref >60)
Glucose, Bld: 104 mg/dL — ABNORMAL HIGH (ref 70–99)
Potassium: 4.4 mEq/L (ref 3.5–5.1)
Sodium: 138 mEq/L (ref 135–145)

## 2010-10-15 LAB — GLUCOSE, CAPILLARY: Glucose-Capillary: 115 mg/dL — ABNORMAL HIGH (ref 70–99)

## 2010-10-23 ENCOUNTER — Ambulatory Visit: Payer: BLUE CROSS/BLUE SHIELD | Attending: Radiation Oncology | Admitting: Radiation Oncology

## 2010-11-14 ENCOUNTER — Ambulatory Visit (HOSPITAL_COMMUNITY)
Admission: RE | Admit: 2010-11-14 | Discharge: 2010-11-14 | Disposition: A | Discharge status: Home / Self Care | Payer: BLUE CROSS/BLUE SHIELD | Source: Ambulatory Visit | Attending: Oncology | Admitting: Oncology

## 2010-11-14 ENCOUNTER — Other Ambulatory Visit: Payer: Self-pay | Admitting: Oncology

## 2010-11-14 DIAGNOSIS — C099 Malignant neoplasm of tonsil, unspecified: Secondary | ICD-10-CM

## 2010-11-14 DIAGNOSIS — C029 Malignant neoplasm of tongue, unspecified: Secondary | ICD-10-CM

## 2010-11-14 DIAGNOSIS — R1310 Dysphagia, unspecified: Secondary | ICD-10-CM | POA: Insufficient documentation

## 2010-11-14 DIAGNOSIS — R609 Edema, unspecified: Secondary | ICD-10-CM | POA: Insufficient documentation

## 2010-11-14 DIAGNOSIS — R6884 Jaw pain: Secondary | ICD-10-CM | POA: Insufficient documentation

## 2010-11-14 MED ORDER — IOHEXOL 300 MG/ML  SOLN
100.0000 mL | Freq: Once | INTRAMUSCULAR | Status: AC | PRN
  Administered 2010-11-14: 100 mL via INTRAVENOUS

## 2010-11-19 ENCOUNTER — Other Ambulatory Visit: Payer: Self-pay | Admitting: Oncology

## 2010-11-19 ENCOUNTER — Encounter (HOSPITAL_BASED_OUTPATIENT_CLINIC_OR_DEPARTMENT_OTHER): Payer: BLUE CROSS/BLUE SHIELD | Admitting: Oncology

## 2010-11-19 DIAGNOSIS — B370 Candidal stomatitis: Secondary | ICD-10-CM

## 2010-11-19 DIAGNOSIS — C099 Malignant neoplasm of tonsil, unspecified: Secondary | ICD-10-CM

## 2010-11-19 DIAGNOSIS — Z5111 Encounter for antineoplastic chemotherapy: Secondary | ICD-10-CM

## 2010-11-19 DIAGNOSIS — B977 Papillomavirus as the cause of diseases classified elsewhere: Secondary | ICD-10-CM

## 2010-11-19 DIAGNOSIS — E038 Other specified hypothyroidism: Secondary | ICD-10-CM

## 2010-11-19 DIAGNOSIS — Z452 Encounter for adjustment and management of vascular access device: Secondary | ICD-10-CM

## 2010-11-19 DIAGNOSIS — Z87891 Personal history of nicotine dependence: Secondary | ICD-10-CM

## 2010-11-19 LAB — COMPREHENSIVE METABOLIC PANEL
ALT: 20 U/L (ref 0–53)
AST: 19 U/L (ref 0–37)
Albumin: 4.3 g/dL (ref 3.5–5.2)
Alkaline Phosphatase: 60 U/L (ref 39–117)
BUN: 9 mg/dL (ref 6–23)
CO2: 26 mEq/L (ref 19–32)
Calcium: 9.4 mg/dL (ref 8.4–10.5)
Chloride: 105 mEq/L (ref 96–112)
Creatinine, Ser: 1.03 mg/dL (ref 0.40–1.50)
Glucose, Bld: 82 mg/dL (ref 70–99)
Potassium: 4.4 mEq/L (ref 3.5–5.3)
Sodium: 140 mEq/L (ref 135–145)
Total Bilirubin: 0.5 mg/dL (ref 0.3–1.2)
Total Protein: 6.7 g/dL (ref 6.0–8.3)

## 2010-11-19 LAB — CBC WITH DIFFERENTIAL/PLATELET
BASO%: 0.3 % (ref 0.0–2.0)
Basophils Absolute: 0 10*3/uL (ref 0.0–0.1)
EOS%: 2.1 % (ref 0.0–7.0)
Eosinophils Absolute: 0.1 10*3/uL (ref 0.0–0.5)
HCT: 43.2 % (ref 38.4–49.9)
HGB: 14.9 g/dL (ref 13.0–17.1)
LYMPH%: 13.1 % — ABNORMAL LOW (ref 14.0–49.0)
MCH: 32.7 pg (ref 27.2–33.4)
MCHC: 34.5 g/dL (ref 32.0–36.0)
MCV: 94.8 fL (ref 79.3–98.0)
MONO#: 0.5 10*3/uL (ref 0.1–0.9)
MONO%: 9.2 % (ref 0.0–14.0)
NEUT#: 4.2 10*3/uL (ref 1.5–6.5)
NEUT%: 75.3 % — ABNORMAL HIGH (ref 39.0–75.0)
Platelets: 210 10*3/uL (ref 140–400)
RBC: 4.56 10*6/uL (ref 4.20–5.82)
RDW: 13 % (ref 11.0–14.6)
WBC: 5.6 10*3/uL (ref 4.0–10.3)
lymph#: 0.7 10*3/uL — ABNORMAL LOW (ref 0.9–3.3)

## 2010-11-19 LAB — TSH: TSH: 5.629 u[IU]/mL — ABNORMAL HIGH (ref 0.350–4.500)

## 2010-11-20 LAB — T4, FREE: Free T4: 0.97 ng/dL (ref 0.80–1.80)

## 2010-11-25 NOTE — Op Note (Signed)
Randy Pham, Randy Pham                ACCOUNT NO.:  [id number]   MEDICAL RECORD NO.:  [id number]          PATIENT TYPE:  INP   LOCATION:  1606                         FACILITY:  [facility]   PHYSICIAN:  Johnette Abraham, MD    DATE OF BIRTH:  1961/05/27   DATE OF PROCEDURE:  05/08/2008  DATE OF DISCHARGE:                               OPERATIVE REPORT   PREOPERATIVE DIAGNOSIS:  Abscess, early extensor tenosynovitis of the  left index finger - MRSA.   POSTOPERATIVE DIAGNOSIS:  Abscess, early extensor tenosynovitis of the  left index finger -MRSA.   PROCEDURE:  Incision and drainage of the abscess of the left index  finger.   ANESTHESIA:  Local with 1% lidocaine without epinephrine - regional  block.   FINDINGS:  Small amount of pus, previous cultures showed MRSA.   INDICATIONS:  Mr. Loberg is a 50 year old gentleman who was admitted to  the hospital with an abscess of the left index finger. It has cultured  out MRSA by his primary care physician. I was consulted for definitive  care. The patient was counseled on the procedure, risks and  alternatives. He gave consent to proceed.   PROCEDURE:  The left index finger and the dorsum of the hand were  prepped and draped in normal sterile fashion. The site was anesthetized  with 1% lidocaine without epinephrine, giving a good field block. The  area of most fluctuance was identified. An 11-blade scalpel was used to  make a cruciate incision. The wound was gently manipulated, and a small  amount of pus was expressed. Gentle probing of the wound to uncover any  loculated pockets was then performed. None were found. The wound ws  irrigated.  Following, a small portion of gauze sponge was rolled and  placed inside the wound to act as a wick to allow drainage of the wound.  Hemostasis was obtained.  A sterile dressing was applied. The patient  tolerated the procedure well without any acute complications.      Johnette Abraham, MD  Electronically  Signed     HCC/MEDQ  D:  05/08/2008  T:  05/08/2008  Job:  [id number]

## 2010-11-25 NOTE — H&P (Signed)
Randy Pham, Randy Pham                ACCOUNT NO.:  [id number]   MEDICAL RECORD NO.:  [id number]          PATIENT TYPE:  INP   LOCATION:  1606                         FACILITY:  [facility]   PHYSICIAN:  Corinna L. Lendell Caprice, MDDATE OF BIRTH:  [birth year]   DATE OF ADMISSION:  05/07/2008  DATE OF DISCHARGE:                              HISTORY & PHYSICAL   CHIEF COMPLAINT:  Finger infection.   HISTORY OF PRESENT ILLNESS:  Randy Pham is a pleasant white male who was  directly admitted by Dr. Caryn Bee Little's office with MRSA infection of  his left index finger who had about a week's worth of pain involving the  finger and the dorsum of the hand.  It was also red and warm.  He has  had no fevers or chills.  His appetite has been good.  He thought it was  an ingrown hair initially and poked it with a safety pin.  He saw Dr.  Clarene Duke about a week ago, at which time an incision and drainage was done  in the office.  The culture came back growing MRSA.  His abscess looks a  bit worse.  The cellulitis of his hand has improved since last week.  He  has been on Keflex since being seen in the office.  He noticed also  today that he had a red spot on his right fifth finger as well.   PAST MEDICAL HISTORY:  1. Hyperlipidemia,  2. History of recurrent diverticulitis and previous laparoscopic-      assisted sigmoid colectomy.   MEDICATIONS:  Keflex, Niaspan nightly, Crestor 10 mg a day.   SOCIAL HISTORY:  He smokes.  He does not drink or use drugs.  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   FAMILY HISTORY:  Noncontributory.   REVIEW OF SYSTEMS:  As above otherwise negative.   PHYSICAL EXAMINATION:  VITAL SIGNS:  He is afebrile.  Heart rate 79,  respiratory rate 18, blood pressure 155/90, oxygen saturation 94% on  room air.  GENERAL:  In general, the patient is well-nourished, well-developed in  no acute distress.  HEENT: Normocephalic, atraumatic.  Pupils equal, round, reactive to  light.  Sclera nonicteric.   Moist mucous membranes.  NECK:  Supple.  No lymphadenopathy.  LUNGS:  Clear to auscultation bilaterally without wheezes, rhonchi or  rales.  CARDIOVASCULAR:  Regular rate and rhythm without murmurs,  gallops or rubs. ABDOMEN:  Normal bowel sounds, soft, nontender,  nondistended.  GU/RECTAL:  Deferred.  EXTREMITIES: Over the MCP area of his left index finger, he has about a  1 cm abscess with surrounding erythema and warmth.  There is no drainage  currently.  He has difficulty moving the MCP joint due to pain.  There  is minimal cellulitis of the hand otherwise.  He also has a small  erythematous papules over the MCP area of his right fifth finger.  No  clubbing, cyanosis or edema.  SKIN:  He has a tattoo over the right abdominal and chest area which has  erythematous papules but no obvious pus or abscess in this area.  NEUROLOGIC:  The patient is alert and oriented.  Sensory motor exams  grossly intact.  PSYCHIATRIC:  Normal affect.   LABORATORY DATA:  The culture done as an outpatient shows methicillin-  resistant Staphylococcus aureus which is sensitive to ciprofloxacin,  clindamycin.  Resistant to erythromycin, sensitive to gentamicin,  levofloxacin, linezolid,  Resistant to oxacillin, penicillin, sensitive  to rifampin, tetracycline, trimethoprim, sulfa and vancomycin.   ASSESSMENT/PLAN:  1. Left index finger abscess.  Certainly I am concerned about joint or      tendon involvement.  I will call the hand surgeon to consult.  The      patient will need incision and drainage.  I will start vancomycin.      Apparently, the nurses have been having difficulty getting an IV in      place and the IV nurse may be unable to get here for quite some      time.  Therefore, I will give him a dose of oral doxycycline until      vancomycin can be started.  He will get Tylenol and Dilaudid as      needed.  2. Tobacco abuse, counseled against.  Defers a nicotine patch at this      time.  3.  Hyperlipidemia.  Continue outpatient medications.   The patient will be placed on contact precautions of course.      Corinna L. Lendell Caprice, MD  Electronically Signed     CLS/MEDQ  D:  05/09/2008  T:  05/09/2008  Job:  [id number]   cc:   Chales Salmon. Abigail Miyamoto, M.D.  Fax: [phone]

## 2010-11-28 NOTE — Consult Note (Signed)
Randy Pham, Randy Pham                ACCOUNT NO.:  [id number]   MEDICAL RECORD NO.:  [id number]          PATIENT TYPE:  INP   LOCATION:  2015                         FACILITY:  [facility]   PHYSICIAN:  Revonda Standard L. Rennis Harding, N.P. DATE OF BIRTH:  [birth year]   DATE OF CONSULTATION:  DATE OF DISCHARGE:                                   CONSULTATION   DATE OF CONSULTATION:  March 04, 2006   SURGEON:  Dr. Carolynne Edouard   PRIMARY CARE PHYSICIAN:  Dr. Abigail Miyamoto   REASON FOR CONSULTATION:  Recurrent diverticular disease with abscess.   HISTORY OF PRESENT ILLNESS:  Randy Pham is a 50 year old male patient with  prior history of diverticular disease with abscess requiring percutaneous  drainage and antibiotics in August 2006.  He was readmitted on March 02, 2006, secondary to abdominal pain left lower quadrant, was found to have a  recurrence of diverticular disease with small abscesses.  He has also had  associated left ureteral involvement due to the inflammatory changes from  the diverticular process.  Urology is following.  A surgical evaluation has  been requested to see if eventual colectomy is needed.  Patient currently is  being treated with IV Cipro and Flagyl, bowel rest, and his white count has  decreased to 11,000.   REVIEW OF SYSTEMS:  As per the history of present illness, patient has been  in a normal state of health since the last discharge and has only been sick  for several days prior to this admission.   PAST MEDICAL HISTORY:  1. Dyslipidemia.  2. Prior diverticular disease with abscess.   PAST SURGICAL HISTORY:  Percutaneous drainage of pelvic abscess secondary to  diverticular disease in August 2006.   ALLERGIES:  DILAUDID which causes itching.   CURRENT MEDICATIONS:  Protonix IV, Cipro IV, Flagyl IV, IV fluids, morphine  for pain.  Patient has recently been restarted on home medications of  Tricor, Crestor, Chantix, folic acid, vitamin E and baby aspirin.   SOCIAL HISTORY:  No  tobacco.  No alcohol.   FAMILY HISTORY:  Non contributory.   PHYSICAL EXAMINATION:  GENERAL:  Well developed well nourished male in no  acute distress.  VITAL SIGNS:  Temperature 97.9, T-max in the past 24 hours is 99.0, blood  pressure 113/76, pulse 65, regular respirations 20.  NEURO:  Patient is alert and oriented x3.  Movement in all extremities x4.  HEENT:  Head is normocephalic.  Throat not injected.  Pupils equal and react  to light.  NECK:  Supple, no adenopathy.  CHEST:  Bilateral lung sounds clear to auscultation.  Respiratory effort non  labored.  He is on room air.  CARDIAC:  Sinus rhythm.  Pulse is regular.  No rubs, murmurs, thrills.  No  gallop.  ABDOMEN:  Soft, non tender, non distended.  Bowel sounds are present.  EXTREMITIES:  Symmetrical in appearance.  No edema, cyanosis or clubbing.   LABORATORY DATA:  White count 11,000, initial white count on admission was  5.200, hemoglobin 12.6, platelets 202,000, sodium 138, potassium 2.4, CO2 of  25, BUN 10,  creatinine 1.2.   DIAGNOSTICS:  CT of the abdomen and pelvis on March 02, 2006, demonstrates  inflammatory changes of the sigmoid colon with phlegmon early abscess  collection with associated thickening of the ureter on the left.   IMPRESSION:  1. Recurrent diverticular disease with recurrent abscess, small.  2. Left ureteral thickening secondary to inflammation.   PLAN:  This a 50 year old male patient with recurrent diverticulitis who is  doing very well on IV antibiotics.  Dr. Carolynne Edouard recommends continuing IV  antibiotics until fever gone and until white count is normal then switch to  oral antibiotics.  Advance diet slowly to a low residue diet.  Follow up  with Dr. Carolynne Edouard in the outpatient setting [phone].  Since his diverticulitis  is complicated and recurrent, I think he will require sigmoid colectomy at  some point.  He is doing well enough that if we can get him 6 weeks out from  this most recent  exacerbation, we will be able to do this without a  colostomy.  Dr. Carolynne Edouard recommends continuing oral Cipro and Flagyl until the  time of surgery.  We will follow along with you.      Allison L. Rennis Harding, N.P.     ALE/MEDQ  D:  03/04/2006  T:  03/05/2006  Job:  [id number]   cc:   Chales Salmon. Abigail Miyamoto, M.D.

## 2010-11-28 NOTE — Discharge Summary (Signed)
Randy Pham, Randy Pham                ACCOUNT NO.:  [id number]   MEDICAL RECORD NO.:  [id number]          PATIENT TYPE:  INP   LOCATION:  5709                         FACILITY:  [facility]   PHYSICIAN:  Clovis Pu. Cornett, M.D.DATE OF BIRTH:  August 05, 1960   DATE OF ADMISSION:  02/09/2005  DATE OF DISCHARGE:  02/14/2005                                 DISCHARGE SUMMARY   ADMITTING DIAGNOSIS:  Diverticulitis with pelvic abscess.   DISCHARGE DIAGNOSIS:  Diverticulitis with pelvic abscess.   PROCEDURES PERFORMED:  CT guided percutaneous drainage of pelvic abscess.   BRIEF HISTORY:  The patient is a 50 year old male admitted on February 09, 2005,  because of abdominal pain.  He was seen by his primary care doctor prior to  this and treated with a one-day course of antibiotics.  He returned to see  him and was found to have a pelvic abscess.  He was admitted, and this was  percutaneously drained on February 10, 2005.  He had a repeat CT scan done  three days later, which showed resolution of this.   HOSPITAL COURSE:  Hospital course is documented above.  The patient's white  count remained normal.  He had a normal fever curve during his stay.  His  percutaneous drain was removed on February 13, 2005.  He was kept overnight  after that and was feeling well  the next day with no abdominal pain, a  benign abdominal exam.  He had no fever, chills, and had no tachycardia.  His white count was normal at 9.1.  He was switched over to p.o. antibiotics  for discharge.  He was discharged home in a satisfactory condition on a  regular diet.   DISCHARGE INSTRUCTIONS:  He will follow up with Dr. Luisa Hart in two to three  weeks.  He already has Cipro and Flagyl at home.  We will have him take that  for the next seven days until they are gone.  I told him to call if he  develops any abdominal pain, nausea, vomiting, fever, chills, or worsening  of his condition.  He will resume a regular diet, and his activities will be  ad lib.   CONDITION ON DISCHARGE:  Satisfactory.       TAC/MEDQ  D:  02/14/2005  T:  02/15/2005  Job:  [id number]

## 2010-11-28 NOTE — H&P (Signed)
Randy Pham, Randy Pham                ACCOUNT NO.:  [id number]   MEDICAL RECORD NO.:  [id number]          PATIENT TYPE:  INP   LOCATION:  2015                         FACILITY:  [facility]   PHYSICIAN:  Hollice Espy, M.D.DATE OF BIRTH:  February 27, 1961   DATE OF ADMISSION:  03/02/2006  DATE OF DISCHARGE:                                HISTORY & PHYSICAL   CHIEF COMPLAINT:  Abdominal pain in the left lower quadrant.   HISTORY OF PRESENT ILLNESS:  The patient is a 50 year old white male with a  past medical history of diverticulitis with pelvic abscess status post  drainage one year ago as well as hyperlipidemia.  The patient was sent to  the emergency room  today after having episodes of abdominal pain in the  left lower quadrant.  He underwent a CT scan which showed evidence of some  sigmoid wall thickening consistent with diverticulitis but also concerning  was a left hydroureter with left hydronephrosis, as well.  The patient was  also noted to have an elevated white count of 15,000 and was admitted to  [facility] for further evaluation.  It was felt that likely the  patient had diverticulitis as well as a component of hydronephrosis.  The  patient was initially started on IV antibiotics, sent to the floor for  further evaluation, and urology was consulted.  Urology evaluated the  patient and felt that he had left extrinsic ureteral compression secondary  to his diverticulitis and they did not recommend a stent placement at this  time but, rather, treatment of the underlying issue.  The patient, himself,  has been doing well.  He says the pain and nausea medicine he has been  receiving helped somewhat, but he still complains of some returning pain,  most notably in the left lower quadrant of his belly and also in the left  flank.  He denies any other complaints such as headaches, vision changes,  dysphagia, chest pain, palpitations, shortness of breath, wheezes, cough,  hematuria,  dysuria, constipation.  He cannot remember the last time he had a  bowel movement, although it was liquidy but obviously not taking in very  much food.  No focal extremity numbness, weakness, or pain.  Review of  systems, otherwise, negative.   PAST MEDICAL HISTORY:  Hyperlipidemia and diverticulitis status post needle  drainage of abscess one year ago.   MEDICATIONS:  The patient is currently on Tricor and Crestor.   ALLERGIES:  FLAGYL.   SOCIAL HISTORY:  He denies any tobacco, alcohol, or drug use.   FAMILY HISTORY:  Noncontributory.   PHYSICAL EXAMINATION:  VITAL SIGNS:  Temperature 97.7, heart rate 79, blood pressure 131/91,  respirations 20, 02 saturation is 100% on room air.  GENERAL:  The patient is alert and oriented x3, in some distress secondary  to pain.  HEENT:  Normocephalic, atraumatic, mucous membranes dry.  NECK:  No carotid bruits.  HEART:  Regular rate and rhythm, S1 and S2.  LUNGS:  Clear to auscultation bilaterally.  ABDOMEN:  Soft, distended, some tenderness with deep palpation of the left  lower  quadrant and hypoactive bowel sounds.  EXTREMITIES:  No cyanosis or clubbing.  Trace pitting edema.   LABORATORY DATA:  CT as per HPI.  UA negative.  Lipase negative.  Sodium  136, potassium 4.1, chloride 105, bicarb 23, BUN 13, creatinine 1.2, glucose  98.  LFTs notable for an ALT 53.  White count 15.2 with an 80% shift,  hemoglobin 14.9, hematocrit 42.6, MCV 92, platelets 152.   ASSESSMENT AND PLAN:  1. Diverticulitis.  Start the patient on IV Cipro.  He tells me he has an      allergy to Flagyl.  If this is true, this is a somewhat rare allergy.      I would favor starting him on Cipro, holding the Flagyl for now, check      lab work again in the morning as well as assessing his symptoms.  Will      need to slowly advance diet.  2. Hydronephrosis.  Extrinsic compression secondary to diverticulitis.      For now will conservatively manage and treat underlying  issue.  3. Renal insufficiency, this is mild secondary to dehydration from above 1      and 2.  4. Hyperlipidemia, holding his p.o. medications.      Hollice Espy, M.D.  Electronically Signed     SKK/MEDQ  D:  03/02/2006  T:  03/02/2006  Job:  [id number]   cc:   Jackie Plum, M.D.  Chales Salmon. Abigail Miyamoto, M.D.  Thomas A. Cornett, M.D.

## 2010-11-28 NOTE — H&P (Signed)
NAMEWINTER, TREFZ                ACCOUNT NO.:  [id number]   MEDICAL RECORD NO.:  [id number]          PATIENT TYPE:  INP   LOCATION:  1825                         FACILITY:  [facility]   PHYSICIAN:  Clovis Pu. Cornett, M.D.DATE OF BIRTH:  08-14-1960   DATE OF ADMISSION:  02/09/2005  DATE OF DISCHARGE:                                HISTORY & PHYSICAL   CHIEF COMPLAINT:  Left lower quadrant pain.   HISTORY OF PRESENT ILLNESS:  Mr. Doolen presented to his primary care  physician last week complaining of intermittent one-month history of left  lower quadrant pain.  A CT scan confirmed diverticulitis.  He as placed on  Cipro and Flagyl, and then a repeat CT scan today revealed diverticulitis  with abscess.  He was then sent to the hospital for surgical management.   ALLERGIES:  No known drug allergies.   MEDICATIONS:  He is currently on oral Cipro and Flagyl but no regular  medications.   PAST MEDICAL HISTORY:  None significant.   FAMILY HISTORY:  Family history of emphysema.   REVIEW OF SYSTEMS:  Abdominal pain, left lower quadrant.  No chest pain, no  shortness of breath.  Review of Systems otherwise negative except for some  palpitations.   PHYSICAL EXAMINATION:  VITAL SIGNS:  Temperature 98.3, pulse 68,  respirations 16, blood pressure 122/70.  GENERAL:  He appears in mild pain.  NECK:  No JVD or thyromegaly.  HEENT:  Sclerae clear. Conjunctivae normal.  Ears without discharge.  HEART:  Regular rate and rhythm.  LUNGS:  Clear.  ABDOMEN:  Left lower quadrant tenderness.  EXTREMITIES:  No peripheral edema.  SKIN:  Warm and dry.   CT scan reveals left lower quadrant abscess, confirmed with a CT scan at  [facility].   Labs at this time are pending, but I have ordered CMET, CBC, and PT/INR.   IMPRESSION:  Diverticulitis with abscess.   PLAN:  1.  IV fluids.  2.  N.P.O.  3.  IV Cipro, IV Flagyl.  4.  I have consulted interventional radiology for percutaneous  drain.  I      have given Jeananne Rama, the P.A. for interventional radiology, the CT      __________ that contains this patient's studies from [facility].  They      will plan to place the drain tomorrow.        LB/MEDQ  D:  02/09/2005  T:  02/09/2005  Job:  [id number]   cc:   Maisie Fus A. Cornett, M.D.  [address] [place] [place]  [place] [address]

## 2010-11-28 NOTE — Discharge Summary (Signed)
Randy Pham, Randy Pham                ACCOUNT NO.:  [id number]   MEDICAL RECORD NO.:  [id number]           PATIENT TYPE:   LOCATION:  5710                         FACILITY:  [facility]   PHYSICIAN:  Melissa L. Ladona Ridgel, MD  DATE OF BIRTH:  [birth year]   DATE OF ADMISSION:  03/02/2006  DATE OF DISCHARGE:  03/06/2006                                 DISCHARGE SUMMARY   CHIEF COMPLAINT ON ADMISSION:  Left sided abdominal pain in  the left lower  quadrant.   DISCHARGE DIAGNOSIS:  Diverticulitis with Extrinsic compression of the Left  Kidney and ureter.   HISTORY:  The patient was admitted to the hospital for IV antibiotic  therapy, was placed on bowel rest.  He seemed to improve on antibiotic  therapy.  We reveiwed his case with his primary GI physician, and surgical  consult was deemed appropriate at this time as the patient has had previous  flares of diverticulitis with the drainage of abscess as necessary one year  ago.  The opinion of the surgical team was that an elective colectomy would  be appropriate, but he should remain on augmentin until further  determination prior to surgery, which will likely be in November.  1. Hydronephrosis secondary to extrinisic compression by inflammatory      changes related to his bowel.  The patient was evaluated by Urology,      who determined that treatment for his bowel was the appropriate      therapy.  No recommendation was made for placement of a nephrostomy      tube placement. If the kidneys are deemed involved at the time of      surgery, a stent could be placed according to the urologist and they      have expressed that there is no further testing  required including      imaging.  I have spoken with the oncall physician for Dr. Wanda Plump to      determine whether an interim visit is necessary.  They will contact the      patient at home for followup.  2. Tobacco abuse.  The patient was placed on Chantix.  3. Hyperlipidemia.  The patient was  placed on Crestor, Tricor, and      Lipitor.  4. Infectious disease.  The patient will continue on Augmentin 875mg  po      q.12.  He  will be reevaluated by the surgical team and the date of      elective surgery will determined.  It was suggested that the patient      report to his primary care physician if he had diarrhea, nausea,      vomiting, fever, or abdominal pain.   Afebrile Vital signs stable  Normocephalic, atruamatic,  PERRL, EOMI, MMM, neck supply no jvd, no lymph  nodes, no carotid bruits  Chest: Clear to auscultation  CVS: RRR, positive S1S2,  no S3S4 no MRG  Abd: soft nontender, no guarding or rebound, positive bowel sounds  Ext: no clubbing, cyanosis, or edema  Neuro: Non Focal   MEDICATIONS AT DISCHARGE:  Augementin  875 mg PO q 12 hours.  Crestor 10 mg po q hs  Tricor 48mg  po q day  Lipitor 40 mg po q day  Vitamin E  Folic Acid 1mg  po q day  Chantix 1 tab po q day   Follow up as an outpatient with his primary care physician and surgeon.  For  probable elective colectomy in November.   Condition : Stable   DISPOSITION:  Home.      Melissa L. Ladona Ridgel, MD  Electronically Signed     MLT/MEDQ  D:  03/06/2006  T:  03/06/2006  Job:  [id number]   cc:   Chales Salmon. Abigail Miyamoto, M.D.

## 2010-11-28 NOTE — Op Note (Signed)
NAMECLARION, MOONEYHAN                ACCOUNT NO.:  [id number]   MEDICAL RECORD NO.:  [id number]          PATIENT TYPE:  AMB   LOCATION:  DAY                          FACILITY:  [facility]   PHYSICIAN:  Angelia Mould. Derrell Lolling, M.D.DATE OF BIRTH:  05-Jan-1961   DATE OF PROCEDURE:  10/29/2006  DATE OF DISCHARGE:                               OPERATIVE REPORT   PREOPERATIVE DIAGNOSIS:  Ventral incisional hernia.   POSTOPERATIVE DIAGNOSIS:  Ventral incisional hernia.   OPERATION PERFORMED:  Laparoscopic repair of ventral incisional hernia  with Parietex composite mesh.   SURGEON:  Angelia Mould. Derrell Lolling, M.D.   OPERATIVE INDICATIONS:  This is a 50 year old white man who underwent a  laparoscopic assisted sigmoid colectomy for diverticulitis and at [facility] by Dr. Rosalyn Charters in November 2007.  He recovered  from that surgery uneventfully except for a wound infection.  He has  noticed a bulge in the periumbilical area where he had an incision for  his hand port.  On exam, he has a hernia defect there approximately 2.5  to 3 cm in size.  No other hernia defects were noted at any of the  incisions.  The patient is operated upon electively.   OPERATIVE TECHNIQUE:  Following induction of general endotracheal  anesthesia, the patient's abdomen was prepped and draped in a sterile  fashion.  A Foley catheter was inserted prior to the prepping and  draping.  The patient was identified as the correct patient and correct  procedure.  Intravenous antibiotics were given prior to the incision.  An 10 mm OptiVu port was placed in the right lateral abdominal wall and  the insertion was smooth and atraumatic.  Pneumoperitoneum was created.  Video camera was inserted.  I saw no bleeding or injury to the  intestinal tract.  I observed adhesions in the abdominal wall near the  midline around the umbilicus.  I placed a 5 mm trocar in the right lower  abdomen.  Using a harmonic scalpel, I took all the  adhesions down.  I  looked around and saw no other problems.   Using a spinal needle, I marked out the edges of the hernia defect and  it was about 3 cm in this diameter.  I chose to use a 12 cm circular  piece of Parietex composite mesh.  This was brought to the operative  field.  I marked the six equidistant points around the circumference of  the mesh and on the abdominal wall.  I placed #1 Novofil sutures in the  rough side of the mesh and tied them down and left the sutures long.  We  moisten the mesh with saline.  We rolled the mesh up and inserted it  into the abdominal cavity.  We spread the mesh out.  We brought all of  the fixation sutures out through six equidistant incisions.  Each of the  incisions were about 4 mm in length and we were very careful to use the  Endoclose device in a way that we got about a 1 cm bite of fascia.  After we placed  all the sutures, we lifted up all the sutures and we had  very nice coverage of the defect with no redundancy or unusual tension  on the mesh.  All of the six sutures were then tied, cut, and the knots  buried under the subcutaneous tissue.  A 5 mm screw tacker was used to  tack all the way around the periphery of the mesh and then a few were  placed more centrally.  Great care was taken to space these only about 1  cm apart so as to avoid any gaps in the mesh.  This looked good and  there was no bleeding.  We looked around the abdominal cavity and pelvic  cavity and saw no abnormalities.   We removed the trocars under direct vision.  There was no bleeding from  the trocar sites.  The pneumoperitoneum was released.  The fascia in the  10 mm trocar was closed with 0 Vicryl sutures.  The skin incisions were  closed with  subcuticular sutures of 4-0 Monocryl and Steri-Strips.  Clean bandages  were placed and the patient was taken to the recovery room in stable  condition.  Estimated blood loss was about 15 mL.  Complications were  none.   Sponge, needle, and instrument counts were correct.      Angelia Mould. Derrell Lolling, M.D.  Electronically Signed     HMI/MEDQ  D:  10/29/2006  T:  10/29/2006  Job:  [id number]   cc:   Caryn Bee L. Little, M.D.  Fax: [phone]

## 2010-12-26 IMAGING — PT NM PET TUM IMG RESTAG (PS) SKULL BASE T - THIGH
1 of 6 series · 1 of 25 positions shown · non-contrast
Comparison: PET CT 05/24/2009 CT neck 09/27/2009

CLINICAL DATA: Subsequent treatment strategy for tonsillar squamous
cell carcinoma.

NUCLEAR MEDICINE PET SKULL BASE TO THIGH
Fasting Blood Glucose:  108
TECHNIQUE: 15.6 mCi F-18 FDG was injected intravenously via the
right port.  Full-ring PET imaging was performed from the skull
base through the mid-thighs 100  minutes after injection.  CT data
was obtained and used for attenuation correction and anatomic
localization only.  (This was not acquired as a diagnostic CT
examination.)

[Series 2: ct images · axial · 3.8mm · 0.98mm/px · 1 of 267 slices shown]
[im 267/267  brain]
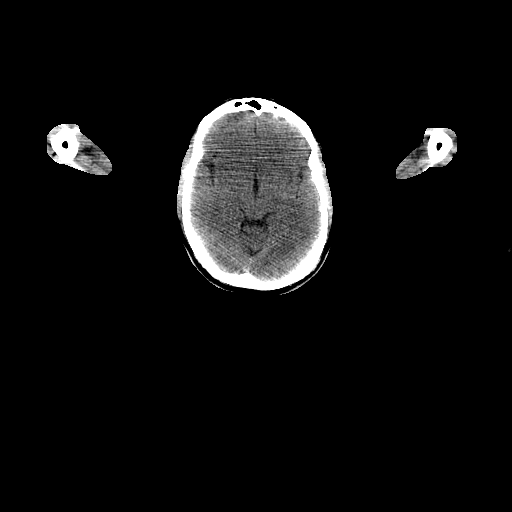

[1 of 25 positions shown; findings below may reference images not displayed]

FINDINGS: Neck:There is no residual asymmetric or increased hypermetabolic
activity within the tonsillar region to suggest local squamous cell
carcinoma recurrence.  The pharyngeal mucosa demonstrates no
abnormal uptake.  There are no hypermetabolic lymph nodes within
the neck to suggest local nodal metastasis.

Chest:No hypermetabolic mediastinal or hilar nodes.  No suspicious
pulmonary nodules.

Abdomen / Pelvis:No abnormal hypermetabolic activity within the
solid organs.  No evidence of abdominal or pelvic hypermetabolic
nodes. Mild uptake associated with the percutaneous gastrostomy
tube

Skeleton:No focal hypermetabolic activity to suggest skeletal
metastasis.
IMPRESSION: 1.  No evidence of local head neck cancer recurrence.
2.  No evidence of local nodal metastasis or distant metastasis.

## 2011-02-12 ENCOUNTER — Ambulatory Visit
Admission: RE | Admit: 2011-02-12 | Discharge: 2011-02-12 | Disposition: A | Discharge status: Home / Self Care | Payer: BC Managed Care – PPO | Source: Ambulatory Visit | Attending: Radiation Oncology | Admitting: Radiation Oncology

## 2011-04-13 LAB — CBC
HCT: 45.4
Hemoglobin: 15.6
MCHC: 34.3
MCV: 95
Platelets: 213
RBC: 4.78
RDW: 12.7
WBC: 8.6

## 2011-04-13 LAB — BASIC METABOLIC PANEL
BUN: 12
BUN: 8
CO2: 28
CO2: 30
Calcium: 8.9
Calcium: 9.3
Chloride: 105
Chloride: 106
Creatinine, Ser: 0.92
Creatinine, Ser: 1.01
GFR calc Af Amer: >60
GFR calc Af Amer: >60
GFR calc non Af Amer: >60
GFR calc non Af Amer: >60
Glucose, Bld: 126 — ABNORMAL HIGH
Glucose, Bld: 127 — ABNORMAL HIGH
Potassium: 4
Potassium: 4.5
Sodium: 140
Sodium: 141

## 2011-04-13 LAB — VANCOMYCIN, TROUGH: Vancomycin Tr: 16.7

## 2011-04-18 ENCOUNTER — Encounter: Payer: Self-pay | Admitting: Oncology

## 2011-04-18 DIAGNOSIS — E038 Other specified hypothyroidism: Secondary | ICD-10-CM | POA: Insufficient documentation

## 2011-04-18 DIAGNOSIS — C099 Malignant neoplasm of tonsil, unspecified: Secondary | ICD-10-CM | POA: Insufficient documentation

## 2011-05-08 ENCOUNTER — Encounter: Payer: Self-pay | Admitting: *Deleted

## 2011-05-19 ENCOUNTER — Ambulatory Visit (HOSPITAL_COMMUNITY)
Admission: RE | Admit: 2011-05-19 | Discharge: 2011-05-19 | Disposition: A | Discharge status: Home / Self Care | Payer: BC Managed Care – PPO | Source: Ambulatory Visit | Attending: Oncology | Admitting: Oncology

## 2011-05-19 ENCOUNTER — Telehealth: Payer: Self-pay | Admitting: Oncology

## 2011-05-19 DIAGNOSIS — M47812 Spondylosis without myelopathy or radiculopathy, cervical region: Secondary | ICD-10-CM | POA: Insufficient documentation

## 2011-05-19 DIAGNOSIS — R1310 Dysphagia, unspecified: Secondary | ICD-10-CM | POA: Insufficient documentation

## 2011-05-19 DIAGNOSIS — C109 Malignant neoplasm of oropharynx, unspecified: Secondary | ICD-10-CM

## 2011-05-19 DIAGNOSIS — C099 Malignant neoplasm of tonsil, unspecified: Secondary | ICD-10-CM | POA: Insufficient documentation

## 2011-05-19 MED ORDER — IOHEXOL 300 MG/ML  SOLN
100.0000 mL | Freq: Once | INTRAMUSCULAR | Status: AC | PRN
  Administered 2011-05-19: 100 mL via INTRAVENOUS

## 2011-05-20 ENCOUNTER — Other Ambulatory Visit (HOSPITAL_BASED_OUTPATIENT_CLINIC_OR_DEPARTMENT_OTHER): Payer: BC Managed Care – PPO | Admitting: Lab

## 2011-05-20 ENCOUNTER — Other Ambulatory Visit: Payer: Self-pay | Admitting: Oncology

## 2011-05-20 ENCOUNTER — Ambulatory Visit (HOSPITAL_BASED_OUTPATIENT_CLINIC_OR_DEPARTMENT_OTHER): Payer: BC Managed Care – PPO | Admitting: Oncology

## 2011-05-20 ENCOUNTER — Telehealth: Payer: Self-pay | Admitting: Oncology

## 2011-05-20 VITALS — BP 118/81 | HR 69 | Temp 97.0°F | Wt 180.8 lb

## 2011-05-20 DIAGNOSIS — C099 Malignant neoplasm of tonsil, unspecified: Secondary | ICD-10-CM

## 2011-05-20 DIAGNOSIS — B977 Papillomavirus as the cause of diseases classified elsewhere: Secondary | ICD-10-CM

## 2011-05-20 DIAGNOSIS — C109 Malignant neoplasm of oropharynx, unspecified: Secondary | ICD-10-CM

## 2011-05-20 LAB — CMP (CANCER CENTER ONLY)
ALT(SGPT): 28 U/L (ref 10–47)
AST: 20 U/L (ref 11–38)
Albumin: 3.5 g/dL (ref 3.3–5.5)
Alkaline Phosphatase: 70 U/L (ref 26–84)
BUN, Bld: 8 mg/dL (ref 7–22)
CO2: 32 mEq/L (ref 18–33)
Calcium: 9.4 mg/dL (ref 8.0–10.3)
Chloride: 96 mEq/L — ABNORMAL LOW (ref 98–108)
Creat: 1 mg/dl (ref 0.6–1.2)
Glucose, Bld: 92 mg/dL (ref 73–118)
Potassium: 4.3 mEq/L (ref 3.3–4.7)
Sodium: 144 mEq/L (ref 128–145)
Total Bilirubin: 0.7 mg/dl (ref 0.20–1.60)
Total Protein: 7.5 g/dL (ref 6.4–8.1)

## 2011-05-20 LAB — CBC WITH DIFFERENTIAL/PLATELET
BASO%: 0.3 % (ref 0.0–2.0)
Basophils Absolute: 0 10*3/uL (ref 0.0–0.1)
EOS%: 2 % (ref 0.0–7.0)
Eosinophils Absolute: 0.1 10*3/uL (ref 0.0–0.5)
HCT: 43.4 % (ref 38.4–49.9)
HGB: 14.9 g/dL (ref 13.0–17.1)
LYMPH%: 8.8 % — ABNORMAL LOW (ref 14.0–49.0)
MCH: 32.9 pg (ref 27.2–33.4)
MCHC: 34.5 g/dL (ref 32.0–36.0)
MCV: 95.5 fL (ref 79.3–98.0)
MONO#: 0.5 10*3/uL (ref 0.1–0.9)
MONO%: 7 % (ref 0.0–14.0)
NEUT#: 5.4 10*3/uL (ref 1.5–6.5)
NEUT%: 81.9 % — ABNORMAL HIGH (ref 39.0–75.0)
Platelets: 251 10*3/uL (ref 140–400)
RBC: 4.54 10*6/uL (ref 4.20–5.82)
RDW: 12.9 % (ref 11.0–14.6)
WBC: 6.6 10*3/uL (ref 4.0–10.3)
lymph#: 0.6 10*3/uL — ABNORMAL LOW (ref 0.9–3.3)

## 2011-05-20 LAB — TSH: TSH: 2.757 u[IU]/mL (ref 0.350–4.500)

## 2011-05-20 NOTE — Telephone Encounter (Signed)
gv pt appt schedule for may 2013 including ct scan for 11/18/2011 @ 9:30 am @ [facility]. appt scheduled w/alisha.

## 2011-05-20 NOTE — Progress Notes (Signed)
[facility] OFFICE PROGRESS NOTE  CC:  Randy Pham. Annalee Genta, M.D.  Artist Pais Kathrynn Running, M.D.  Chales Salmon. Abigail Miyamoto, M.D.  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  PAST THERAPY:  s/p definitive cisplatin/XRT finished in Feb 2011.  He achieved complete response.   CURRENT THERAPY:  watchful observation.  INTERVAL HISTORY:  Randy Pham return to the today with his girlfriend. He reports he is doing well. He is working full-time without fatigue.  He has some xerostomia for which he chews gums and drinks plenty of water during the day.  He is able to eat all kinds of foods without restriction. He did mouth dysphagia, odynophagia, nausea vomiting. He did not headache, mucositis, node swelling, chest pain, abdominal pain, bleeding symptoms, low back pain.  The rest of the 14 point visits and was negative. Of note he later flu shot about a month ago. His last routine screening colonoscopy was when he turned 50, and the next colonoscopy is when he turns 60.  MEDICAL HISTORY: Past Medical History  Diagnosis Date  . Hyperlipidemia   . Hypothyroidism, secondary   . Tonsil neoplasm     cT1, N2b, M0; HPV +    MEDICATIONS: Current Outpatient Prescriptions  Medication Sig Dispense Refill  . levothyroxine (SYNTHROID, LEVOTHROID) 25 MCG tablet Take 25 mcg by mouth daily.        Marland Kitchen omeprazole (PRILOSEC OTC) 20 MG tablet Take 20 mg by mouth daily.        . pilocarpine (SALAGEN) 5 MG tablet Take 5 mg by mouth 4 (four) times daily. As needed for dry mouth         REVIEW OF SYSTEMS:  Pertinent items are noted in HPI.   PHYSICAL EXAMINATION: ECOG PERFORMANCE STATUS: 0  Filed Vitals:   05/20/11 0916  BP: 118/81  Pulse: 69  Temp: 97 F (36.1 C)   General:  well-nourished in no acute distress.  Eyes:  no scleral icterus.  ENT:  There were no oropharyngeal lesions.  Neck was without thyromegaly.  Lymphatics:  Negative cervical,  supraclavicular or axillary adenopathy.  Respiratory: lungs were clear bilaterally without wheezing or crackles.  Cardiovascular:  Regular rate and rhythm, S1/S2, without murmur, rub or gallop.  There was no pedal edema.  GI:  abdomen was soft, flat, nontender, nondistended, without organomegaly.  Muscoloskeletal:  no spinal tenderness of palpation of vertebral spine.  Skin exam was without echymosis, petichae.  Neuro exam was nonfocal.  Patient was able to get on and off exam table without assistance.  Gait was normal.  Patient was alerted and oriented.  Attention was good.   Language was appropriate.  Mood was normal without depression.  Speech was not pressured.  Thought content was not tangential.    LABORATORY DATA:  WBC 6.6; Hgb 14.9; plt 251. Cr 1; LFT within normal limit.  IMAGINGS:  I personally reviewed the CT of the neck performed 05/19/2011 which was negative with detailed as followed.   CT NECK WITH CONTRAST  Technique: Multidetector CT imaging of the neck was performed with  intravenous contrast.  Contrast: OMNIPAQUE IOHEXOL 300 MG/ML IV SOLN  Comparison: CT neck with contrast 11/14/2010.  Findings: Limited imaging of the brain is unremarkable.  Postradiation changes are evident within the left neck. There is  slight asymmetric enlargement of the left submandibular gland,  similar to the prior exam. No significant left-sided adenopathy is  present. There is some  stranding in the left parapharyngeal fat,  also similar to the prior study and likely related postradiation  change.  No focal mucosal or submucosal lesions are evident. The vocal  cords are midline.  The lung apices are clear.  The bone windows demonstrate mild degenerative changes in the  cervical spine and C5-6 and C6-7. No significant stenosis is  evident. No focal lytic or blastic lesions are identified.  IMPRESSION:  1. No evidence for residual or recurrent disease in the neck.  2. Post-treatment  changes, particularly within the left side of  the neck, are stable.   A&P:  1. History of oropharyngeal squamous cell carcinoma:  I discussed with [name] that there is no evidence of recurrent or metastatic disease per clinical history, laboratory tests, imaging .  I advised him to again refrain from smoking and alcohol use, which can increase the risk of recurrence.  He has not been smoking or drinking.  2. Surveillance:  RV with me in 6 months.   3. 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  He is on levothyroxine 25 mcg p.o. daily.  His TSH is pending today.   I did not adjust the dose of levothyroxine today.  4. History of slight liver function test abnormality.  This is resolved at this time.   I encouraged him to see Dr. Osborn Coho and Dr. Margaretmary Dys in between visits with [place] here.  I advised him to contact us with concerning symptoms.

## 2011-05-22 ENCOUNTER — Telehealth: Payer: Self-pay | Admitting: *Deleted

## 2011-05-22 NOTE — Telephone Encounter (Signed)
PT. CALLED. HE READ AN ARTICLE ON THE INTERNET FROM THE [facility] CONCERNING A STUDY FOR HEAD AND NECK PATIENTS WITH DRY MOUTH. THE FIRST GENE SALIVARY GLAND STUDY. IS DR.HA AWARE OF THIS STUDY? COULD PT. PARTICIPATE? PHONE #[phone] FOR Monday. THIS NOTE TO DR.HA'S BLUE FOLDER.

## 2011-06-07 NOTE — Telephone Encounter (Signed)
A user error has taken place.

## 2011-06-09 ENCOUNTER — Telehealth: Payer: Self-pay | Admitting: *Deleted

## 2011-06-09 NOTE — Telephone Encounter (Signed)
Returned call from pt who is inquiring about study he read about in [employer] called '1st Gene Therapy on Salivary Glands, Aquaporin-1'. He states he is interested in this study due to his dry mouth s/p radiation treatments, asking if Dr Kathrynn Running is familiar. Informed him dr is out of office this week but will relay this message to him.  Pt verbalized understanding.

## 2011-06-18 NOTE — Telephone Encounter (Signed)
I called Mr. Randy Pham to discuss his question. I reviewed the report that he was describing. It is a report of a phase I gene therapy study from the [employer]. I explained that based on my review, the report is based on very pulmonary work at an early level in the scientific study of this new therapy. It's not clear whether this therapy will be proven to be effective or safe in the long-term on more patients with longer followup. However, and she I assured him I would continue to follow the progress in the event that the the treatment shows promise. I also explained that the [facility] may be looking for volunteers for a followup study if you would like to travel to [facility] for evaluation. He is not interested in pursuing that at this time.

## 2011-11-12 ENCOUNTER — Telehealth: Payer: Self-pay | Admitting: *Deleted

## 2011-11-12 NOTE — Telephone Encounter (Signed)
Pt called stating he has card for 9 month FU w/Dr Kathrynn Running today, but he is unsure if he is supposed to be seen today.  Pt has ct scan neck/chest  on 11/18/11 and FU w/Dr Gaylyn Rong 11/20/11. Pt states he would like to see Dr Gaylyn Rong and ask if it is necessary to be seen by Dr Kathrynn Running as well. Pt is aware that typically he would be followed x 5 yrs past his treatment for head/neck cancer. His radiation treatment was Jan 2011. Informed pt he is not on schedule to see Dr Kathrynn Running today, but will route question to dr re: if he needs to have FU in radiation oncology. Pt verbalized understanding, agreement.  11/16/11 pt scheduled for 3 mos FU per Dr Kathrynn Running, will be seen 02/25/12. Pt will receive phone call and reminder card per R Flynt.

## 2011-11-15 NOTE — Telephone Encounter (Signed)
I would recommend follow-up in radiation oncology following head and neck radiotherapy, but, we don't need to see him the same week as medical oncology.  I could see him back in 3 months to space things out.  MM

## 2011-11-16 NOTE — Telephone Encounter (Signed)
Pt scheduled for FU on 02/25/12 per R Flynt. Pt was called and will be mailed reminder card.

## 2011-11-18 ENCOUNTER — Other Ambulatory Visit (HOSPITAL_BASED_OUTPATIENT_CLINIC_OR_DEPARTMENT_OTHER): Payer: BC Managed Care – PPO | Admitting: Lab

## 2011-11-18 ENCOUNTER — Ambulatory Visit (HOSPITAL_COMMUNITY)
Admission: RE | Admit: 2011-11-18 | Discharge: 2011-11-18 | Disposition: A | Discharge status: Home / Self Care | Payer: BC Managed Care – PPO | Source: Ambulatory Visit | Attending: Oncology | Admitting: Oncology

## 2011-11-18 DIAGNOSIS — C099 Malignant neoplasm of tonsil, unspecified: Secondary | ICD-10-CM | POA: Insufficient documentation

## 2011-11-18 DIAGNOSIS — Z9221 Personal history of antineoplastic chemotherapy: Secondary | ICD-10-CM | POA: Insufficient documentation

## 2011-11-18 DIAGNOSIS — C109 Malignant neoplasm of oropharynx, unspecified: Secondary | ICD-10-CM

## 2011-11-18 DIAGNOSIS — R1310 Dysphagia, unspecified: Secondary | ICD-10-CM | POA: Insufficient documentation

## 2011-11-18 DIAGNOSIS — Z923 Personal history of irradiation: Secondary | ICD-10-CM | POA: Insufficient documentation

## 2011-11-18 LAB — CMP (CANCER CENTER ONLY)
ALT(SGPT): 28 U/L (ref 10–47)
AST: 22 U/L (ref 11–38)
Albumin: 3.5 g/dL (ref 3.3–5.5)
Alkaline Phosphatase: 85 U/L — ABNORMAL HIGH (ref 26–84)
BUN, Bld: 11 mg/dL (ref 7–22)
CO2: 26 mEq/L (ref 18–33)
Calcium: 9.1 mg/dL (ref 8.0–10.3)
Chloride: 100 mEq/L (ref 98–108)
Creat: 1.1 mg/dl (ref 0.6–1.2)
Glucose, Bld: 100 mg/dL (ref 73–118)
Potassium: 4.3 mEq/L (ref 3.3–4.7)
Sodium: 141 mEq/L (ref 128–145)
Total Bilirubin: 0.7 mg/dl (ref 0.20–1.60)
Total Protein: 7.2 g/dL (ref 6.4–8.1)

## 2011-11-18 LAB — CBC WITH DIFFERENTIAL/PLATELET
BASO%: 0.5 % (ref 0.0–2.0)
Basophils Absolute: 0 10*3/uL (ref 0.0–0.1)
EOS%: 1.5 % (ref 0.0–7.0)
Eosinophils Absolute: 0.1 10*3/uL (ref 0.0–0.5)
HCT: 44.6 % (ref 38.4–49.9)
HGB: 15.3 g/dL (ref 13.0–17.1)
LYMPH%: 11.2 % — ABNORMAL LOW (ref 14.0–49.0)
MCH: 30.9 pg (ref 27.2–33.4)
MCHC: 34.3 g/dL (ref 32.0–36.0)
MCV: 90.1 fL (ref 79.3–98.0)
MONO#: 0.6 10*3/uL (ref 0.1–0.9)
MONO%: 7.7 % (ref 0.0–14.0)
NEUT#: 5.9 10*3/uL (ref 1.5–6.5)
NEUT%: 79.1 % — ABNORMAL HIGH (ref 39.0–75.0)
Platelets: 220 10*3/uL (ref 140–400)
RBC: 4.95 10*6/uL (ref 4.20–5.82)
RDW: 12.9 % (ref 11.0–14.6)
WBC: 7.4 10*3/uL (ref 4.0–10.3)
lymph#: 0.8 10*3/uL — ABNORMAL LOW (ref 0.9–3.3)

## 2011-11-18 LAB — TSH: TSH: 3.448 u[IU]/mL (ref 0.350–4.500)

## 2011-11-18 MED ORDER — IOHEXOL 300 MG/ML  SOLN
100.0000 mL | Freq: Once | INTRAMUSCULAR | Status: AC | PRN
  Administered 2011-11-18: 100 mL via INTRAVENOUS

## 2011-11-20 ENCOUNTER — Ambulatory Visit (HOSPITAL_BASED_OUTPATIENT_CLINIC_OR_DEPARTMENT_OTHER): Payer: BC Managed Care – PPO | Admitting: Oncology

## 2011-11-20 ENCOUNTER — Telehealth: Payer: Self-pay | Admitting: Oncology

## 2011-11-20 VITALS — BP 108/76 | HR 65 | Temp 97.4°F | Ht 70.0 in | Wt 179.1 lb

## 2011-11-20 DIAGNOSIS — B977 Papillomavirus as the cause of diseases classified elsewhere: Secondary | ICD-10-CM

## 2011-11-20 DIAGNOSIS — C099 Malignant neoplasm of tonsil, unspecified: Secondary | ICD-10-CM

## 2011-11-20 DIAGNOSIS — E038 Other specified hypothyroidism: Secondary | ICD-10-CM

## 2011-11-20 DIAGNOSIS — D490 Neoplasm of unspecified behavior of digestive system: Secondary | ICD-10-CM

## 2011-11-20 NOTE — Telephone Encounter (Signed)
Gv pt appt for nov2013 °

## 2011-11-20 NOTE — Patient Instructions (Signed)
A.  Result of CT neck 11/18/11.  CT NECK WITH CONTRAST  Technique: Multidetector CT imaging of the neck was performed with  intravenous contrast.  Contrast: OMNIPAQUE IOHEXOL 300 MG/ML SOLN  Comparison: 05/19/2011 and earlier.  Findings: Post therapy changes including mild diffuse soft tissue  thickening of the pharyngeal mucosal spaces. Interval decreased  retropharyngeal effusion. Persistent but decreased thickening of  the platysma and mild subcutaneous stranding greater on the left.  No pharyngeal mass. No cervical lymphadenopathy.  Stable and negative sublingual and parapharyngeal spaces. Stable  parotid and submandibular glands. Negative larynx and thyroid. No  superior mediastinal lymphadenopathy. Major vascular structures  are patent. Left greater than right carotid atherosclerosis.  Negative visualized brain parenchyma and orbits. No acute osseous  abnormality identified. Visualized paranasal sinuses and mastoids  are clear. Stable lung apices.   IMPRESSION:  Satisfactory post-therapy appearance of the neck with interval mild  regression of post XRT changes.  B.  Follow up: - every 6 months visit with Medical Oncology.  Since you're 2 years out from finish of therapy; there is no indication for routine neck CT unless you have symptoms. - Please make sure that you also follow up with ENT and Radiation Oncology to have flexible laryngoscopy by these doctors on a routine basis.

## 2011-11-20 NOTE — Progress Notes (Signed)
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   PAST THERAPY: s/p definitive cisplatin/XRT finished in Feb 2011. He achieved complete response.   CURRENT THERAPY: watchful observation.   INTERVAL HISTORY: Randy Pham 51 y.o. male returns for regular follow up.  He still has mild xerostomia and needs to chew gums or drink water to keep his mouth moist.  He denies dysphagia, odynophagia, hoarse voice, node swelling, hearing loss, neck swelling/thickening.  He works full time without fatigue.  He now does not drink EtOH, smokes cigarettes or chew tobacco.   Patient denies fatigue, headache, visual changes, confusion, drenching night sweats, palpable lymph node swelling, mucositis, odynophagia, dysphagia, nausea vomiting, jaundice, chest pain, palpitation, shortness of breath, dyspnea on exertion, productive cough, gum bleeding, epistaxis, hematemesis, hemoptysis, abdominal pain, abdominal swelling, early satiety, melena, hematochezia, hematuria, skin rash, spontaneous bleeding, joint swelling, joint pain, heat or cold intolerance, bowel bladder incontinence, back pain, focal motor weakness, paresthesia, depression, suicidal or homocidal ideation, feeling hopelessness.   Past Medical History  Diagnosis Date  . Hyperlipidemia   . Hypothyroidism, secondary   . Tonsil neoplasm     cT1, N2b, M0; HPV +    No past surgical history on file.  Current Outpatient Prescriptions  Medication Sig Dispense Refill  . levothyroxine (SYNTHROID, LEVOTHROID) 25 MCG tablet Take 25 mcg by mouth daily.        Marland Kitchen omeprazole (PRILOSEC OTC) 20 MG tablet Take 20 mg by mouth daily.        . pilocarpine (SALAGEN) 5 MG tablet Take 5 mg by mouth 4 (four) times daily. As needed for dry mouth         ALLERGIES:  is allergic to  metronidazole hcl.  REVIEW OF SYSTEMS:  The rest of the 14-point review of system was negative.   Filed Vitals:   11/20/11 0911  BP: 108/76  Pulse: 65  Temp: 97.4 F (36.3 C)   Wt Readings from Last 3 Encounters:  11/20/11 179 lb 1.6 oz (81.239 kg)  05/20/11 180 lb 12.8 oz (82.01 kg)  02/12/11 185 lb (83.915 kg)   ECOG Performance status: 0  PHYSICAL EXAMINATION:   General:  well-nourished in no acute distress.  Eyes:  no scleral icterus.  ENT:  There were no oropharyngeal lesions.  Neck was without thyromegaly.  Lymphatics:  Negative cervical, supraclavicular or axillary adenopathy.  Respiratory: lungs were clear bilaterally without wheezing or crackles.  Cardiovascular:  Regular rate and rhythm, S1/S2, without murmur, rub or gallop.  There was no pedal edema.  GI:  abdomen was soft, flat, nontender, nondistended, without organomegaly.  Muscoloskeletal:  no spinal tenderness of palpation of vertebral spine.  Skin exam was without echymosis, petichae.  Neuro exam was nonfocal.  Patient was able to get on and off exam table without assistance.  Gait was normal.  Patient was alerted and oriented.  Attention was good.   Language was appropriate.  Mood was normal without depression.  Speech was not pressured.  Thought content was not tangential.       LABORATORY/RADIOLOGY DATA:  Lab Results  Component Value Date   WBC 7.4 11/18/2011   HGB 15.3 11/18/2011   HCT 44.6 11/18/2011   PLT 220 11/18/2011   GLUCOSE 100 11/18/2011   ALKPHOS 85* 11/18/2011   ALT 20 11/19/2010  ALT 20 11/19/2010   ALT 20 11/19/2010   AST 22 11/18/2011   NA 141 11/18/2011   K 4.3 11/18/2011   CL 100 11/18/2011   CREATININE 1.1 11/18/2011   BUN 11 11/18/2011   CO2 26 11/18/2011    RADIOLOGY:  I personally reviewed the following CT and showed the patient the images.   Ct Soft Tissue Neck W Contrast  11/18/2011  *RADIOLOGY REPORT*  Clinical Data: 51 year old male with history of tonsillar carcinoma diagnosed in 2010.  Chemotherapy and  radiation complete. Dysphagia.  CT NECK WITH CONTRAST  Technique:  Multidetector CT imaging of the neck was performed with intravenous contrast.  Contrast: OMNIPAQUE IOHEXOL 300 MG/ML  SOLN  Comparison: 05/19/2011 and earlier.  Findings: Post therapy changes including mild diffuse soft tissue thickening of the pharyngeal mucosal spaces.  Interval decreased retropharyngeal effusion.  Persistent but decreased thickening of the platysma and mild subcutaneous stranding greater on the left.  No pharyngeal mass.  No cervical lymphadenopathy.  Stable and negative sublingual and parapharyngeal spaces.  Stable parotid and submandibular glands.  Negative larynx and thyroid.  No superior mediastinal lymphadenopathy.  Major vascular structures are patent.  Left greater than right carotid atherosclerosis. Negative visualized brain parenchyma and orbits. No acute osseous abnormality identified.  Visualized paranasal sinuses and mastoids are clear.  Stable lung apices.  IMPRESSION: Satisfactory post-therapy appearance of the neck with interval mild regression of post XRT changes.  Original Report Authenticated By: Harley Hallmark, M.D.     ASSESSMENT AND PLAN:  1. History of oropharyngeal squamous cell carcinoma: I discussed with Randy Pham that there is no evidence of recurrent or metastatic disease per clinical history, laboratory tests, imaging. He has not been smoking, chewing tobacco or drinking. As he is 2 years out from the finish of therapy, there is no indication for routine surveillance neck CT unless he develops concerning symptoms.   2. Follow up: RV with [place] in 6 months.  3. 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 He is on levothyroxine 25 mcg p.o. daily. His TSH is within normal range. I did not adjust the dose of levothyroxine today.  4. History of slight liver function test abnormality. This is resolved at this time.  5. Age-appropriate colon cancer screening:  Per his report, his colonoscopy last year was  negative.  His next one is due when he turns 60.    I encouraged him to see Dr. Osborn Coho and Dr. Margaretmary Dys in between visits with [place] here. I advised him to contact us with concerning symptoms.      The length of time of the face-to-face encounter was 15  minutes. More than 50% of time was spent counseling and coordination of care.

## 2012-02-05 ENCOUNTER — Other Ambulatory Visit: Payer: Self-pay | Admitting: Oncology

## 2012-02-25 ENCOUNTER — Ambulatory Visit: Payer: BC Managed Care – PPO | Attending: Radiation Oncology | Admitting: Radiation Oncology

## 2012-03-02 ENCOUNTER — Encounter: Payer: Self-pay | Admitting: Radiation Oncology

## 2012-03-03 ENCOUNTER — Ambulatory Visit
Admission: RE | Admit: 2012-03-03 | Discharge: 2012-03-03 | Disposition: A | Discharge status: Home / Self Care | Payer: BC Managed Care – PPO | Source: Ambulatory Visit | Attending: Radiation Oncology | Admitting: Radiation Oncology

## 2012-03-03 ENCOUNTER — Encounter: Payer: Self-pay | Admitting: Radiation Oncology

## 2012-03-03 VITALS — BP 127/85 | HR 65 | Temp 97.9°F | Wt 180.5 lb

## 2012-03-03 DIAGNOSIS — D490 Neoplasm of unspecified behavior of digestive system: Secondary | ICD-10-CM

## 2012-03-03 HISTORY — DX: Disturbances of salivary secretion: K11.7

## 2012-03-03 HISTORY — DX: Personal history of irradiation: Z92.3

## 2012-03-03 HISTORY — DX: Personal history of antineoplastic chemotherapy: Z92.21

## 2012-03-03 HISTORY — DX: Dry mouth, unspecified: R68.2

## 2012-03-03 NOTE — Progress Notes (Signed)
  Radiation Oncology         [phone]) [phone] ________________________________  Name: Randy Pham MRN: [id number]  Date: 03/03/2012  DOB: 1961/06/01  Follow-Up Visit Note  CC: Mickie Hillier, MD  Exie Parody, MD  Diagnosis:   51 year old gentleman status post chemo radiotherapy for stage T1 N2b squamous cell carcinoma of the left tonsil.  Interval Since Last Radiation:  32 months  Narrative:  The patient returns today for routine follow-up.  Denies pain.  Still has dry mouth but is manageable.Eats what he wants without difficulty.ct scan of neck performed on 11/18/11 with interval mild regression of post op radiation changes.Next scan in 1 year.  TSH 3.448 on 11/18/11.                               ALLERGIES:  is allergic to metronidazole hcl.  Meds: Current Outpatient Prescriptions  Medication Sig Dispense Refill  . levothyroxine (SYNTHROID, LEVOTHROID) 25 MCG tablet TAKE ONE TABLET BY MOUTH DAILY  90 tablet  0  . omeprazole (PRILOSEC OTC) 20 MG tablet Take 20 mg by mouth daily.        . pilocarpine (SALAGEN) 5 MG tablet Take 5 mg by mouth 4 (four) times daily. As needed for dry mouth         Physical Findings: The patient is in no acute distress. Patient is alert and oriented.  weight is 180 lb 8 oz (81.874 kg). His temperature is 97.9 F (36.6 C). His blood pressure is 127/85 and his pulse is 65. His oxygen saturation is 96%. . Neck is free of lymphadenopathy. Oral cavity contains good dentition. There are scattered to inject headache changes of the mucosa of the posterior oropharynx but no mucosal irregularities to suggest recurrent or persistent disease. Indirect nares exam shows no abnormalities the posterior oropharynx or larynx. Vocal cords are pale and smooth and and and symmetrically upon phonation. No significant changes.  Lab Findings: TSH 3.448 on 11/18/11.  Radiographic Findings: 11/18/11 CT - NED  Impression:  The patient is free of recurrence.  Plan:  Follow-up in 1  year  _____________________________________  Artist Pais. Kathrynn Running, M.D.

## 2012-03-03 NOTE — Progress Notes (Signed)
Here for routine follow up post completion of radiation for tonsillar cancer in February 2011. Denies pain.Still has dry mouth but is manageable.Eats what he wants without difficulty.ct scan of neck performed on 11/18/11 with interval mild regression of post op radiation changes.Next scan in 1 year. TSH 3.448 on 11/18/11.

## 2012-05-11 ENCOUNTER — Other Ambulatory Visit: Payer: Self-pay | Admitting: Oncology

## 2012-05-23 ENCOUNTER — Telehealth: Payer: Self-pay | Admitting: Oncology

## 2012-05-23 ENCOUNTER — Other Ambulatory Visit (HOSPITAL_BASED_OUTPATIENT_CLINIC_OR_DEPARTMENT_OTHER): Payer: BC Managed Care – PPO

## 2012-05-23 ENCOUNTER — Ambulatory Visit (HOSPITAL_BASED_OUTPATIENT_CLINIC_OR_DEPARTMENT_OTHER): Payer: BC Managed Care – PPO | Admitting: Oncology

## 2012-05-23 ENCOUNTER — Encounter: Payer: Self-pay | Admitting: Oncology

## 2012-05-23 VITALS — BP 114/68 | HR 66 | Temp 97.8°F | Resp 20 | Ht 70.0 in | Wt 188.0 lb

## 2012-05-23 DIAGNOSIS — D490 Neoplasm of unspecified behavior of digestive system: Secondary | ICD-10-CM

## 2012-05-23 DIAGNOSIS — E038 Other specified hypothyroidism: Secondary | ICD-10-CM

## 2012-05-23 DIAGNOSIS — C099 Malignant neoplasm of tonsil, unspecified: Secondary | ICD-10-CM

## 2012-05-23 DIAGNOSIS — K117 Disturbances of salivary secretion: Secondary | ICD-10-CM

## 2012-05-23 DIAGNOSIS — B977 Papillomavirus as the cause of diseases classified elsewhere: Secondary | ICD-10-CM

## 2012-05-23 LAB — CBC WITH DIFFERENTIAL/PLATELET
BASO%: 0.4 % (ref 0.0–2.0)
Basophils Absolute: 0 10*3/uL (ref 0.0–0.1)
EOS%: 1.3 % (ref 0.0–7.0)
Eosinophils Absolute: 0.1 10*3/uL (ref 0.0–0.5)
HCT: 44 % (ref 38.4–49.9)
HGB: 15.1 g/dL (ref 13.0–17.1)
LYMPH%: 12.5 % — ABNORMAL LOW (ref 14.0–49.0)
MCH: 31.9 pg (ref 27.2–33.4)
MCHC: 34.3 g/dL (ref 32.0–36.0)
MCV: 92.8 fL (ref 79.3–98.0)
MONO#: 0.5 10*3/uL (ref 0.1–0.9)
MONO%: 7.5 % (ref 0.0–14.0)
NEUT#: 5.5 10*3/uL (ref 1.5–6.5)
NEUT%: 78.3 % — ABNORMAL HIGH (ref 39.0–75.0)
Platelets: 233 10*3/uL (ref 140–400)
RBC: 4.74 10*6/uL (ref 4.20–5.82)
RDW: 13 % (ref 11.0–14.6)
WBC: 7.1 10*3/uL (ref 4.0–10.3)
lymph#: 0.9 10*3/uL (ref 0.9–3.3)

## 2012-05-23 LAB — COMPREHENSIVE METABOLIC PANEL (CC13)
ALT: 27 U/L (ref 0–55)
AST: 21 U/L (ref 5–34)
Albumin: 3.6 g/dL (ref 3.5–5.0)
Alkaline Phosphatase: 88 U/L (ref 40–150)
BUN: 8 mg/dL (ref 7.0–26.0)
CO2: 24 mEq/L (ref 22–29)
Calcium: 9.4 mg/dL (ref 8.4–10.4)
Chloride: 106 mEq/L (ref 98–107)
Creatinine: 1.2 mg/dL (ref 0.7–1.3)
Glucose: 117 mg/dl — ABNORMAL HIGH (ref 70–99)
Potassium: 4.1 mEq/L (ref 3.5–5.1)
Sodium: 137 mEq/L (ref 136–145)
Total Bilirubin: 0.75 mg/dL (ref 0.20–1.20)
Total Protein: 6.9 g/dL (ref 6.4–8.3)

## 2012-05-23 LAB — TSH: TSH: 3.253 u[IU]/mL (ref 0.350–4.500)

## 2012-05-23 NOTE — Progress Notes (Signed)
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   PAST THERAPY: s/p definitive cisplatin/XRT finished in Feb 2011. He achieved complete response.   CURRENT THERAPY: watchful observation.   INTERVAL HISTORY: Randy Pham 51 y.o. male returns for regular follow up.  He still has mild xerostomia and needs to chew gums or drink water to keep his mouth moist.  He denies dysphagia, odynophagia, hoarse voice, node swelling, hearing loss, neck swelling/thickening.  He works full time without fatigue.  He now does not drink EtOH. He reports that he smokes cigarettes intermittently. He is not ready to quit.   Patient denies fatigue, headache, visual changes, confusion, drenching night sweats, palpable lymph node swelling, mucositis, odynophagia, dysphagia, nausea vomiting, jaundice, chest pain, palpitation, shortness of breath, dyspnea on exertion, productive cough, gum bleeding, epistaxis, hematemesis, hemoptysis, abdominal pain, abdominal swelling, early satiety, melena, hematochezia, hematuria, skin rash, spontaneous bleeding, joint swelling, joint pain, heat or cold intolerance, bowel bladder incontinence, back pain, focal motor weakness, paresthesia, depression, suicidal or homocidal ideation, feeling hopelessness.   Past Medical History  Diagnosis Date  . Hyperlipidemia   . Hypothyroidism, secondary   . Tonsil neoplasm     cT1, N2b, M0; HPV +  . S/P radiation therapy 07/15/09 - 08/30/09    Tonsillar Cancer : 79 Gy, 59.4 Gy and 54 Gy in 33 Fractions  . Status post chemotherapy comp. 08/2009    Cisplatin Concurrently with Radiation  . Xerostomia     S/P Radiation Therapy f    History reviewed. No pertinent past surgical history.  Current Outpatient Prescriptions  Medication Sig Dispense Refill  .  levothyroxine (SYNTHROID, LEVOTHROID) 25 MCG tablet TAKE ONE TABLET BY MOUTH DAILY  90 tablet  3  . omeprazole (PRILOSEC OTC) 20 MG tablet Take 20 mg by mouth daily.        . pilocarpine (SALAGEN) 5 MG tablet Take 5 mg by mouth 4 (four) times daily. As needed for dry mouth         ALLERGIES:  is allergic to metronidazole hcl.  REVIEW OF SYSTEMS:  The rest of the 14-point review of system was negative.   Filed Vitals:   05/23/12 0935  BP: 114/68  Pulse: 66  Temp: 97.8 F (36.6 C)  Resp: 20   Wt Readings from Last 3 Encounters:  05/23/12 188 lb (85.276 kg)  03/03/12 180 lb 8 oz (81.874 kg)  11/20/11 179 lb 1.6 oz (81.239 kg)   ECOG Performance status: 0  PHYSICAL EXAMINATION:   General:  well-nourished in no acute distress.  Eyes:  no scleral icterus.  ENT:  There were no oropharyngeal lesions.  Neck was without thyromegaly.  Lymphatics:  Negative cervical, supraclavicular or axillary adenopathy.  Respiratory: lungs were clear bilaterally without wheezing or crackles.  Cardiovascular:  Regular rate and rhythm, S1/S2, without murmur, rub or gallop.  There was no pedal edema.  GI:  abdomen was soft, flat, nontender, nondistended, without organomegaly.  Muscoloskeletal:  no spinal tenderness of palpation of vertebral spine.  Skin exam was without echymosis, petichae.  Neuro exam was nonfocal.  Patient was able to get on and off exam table without assistance.  Gait was normal.  Patient was alerted and oriented.  Attention was good.   Language was appropriate.  Mood was normal without depression.  Speech was  not pressured.  Thought content was not tangential.       LABORATORY/RADIOLOGY DATA:  Lab Results  Component Value Date   WBC 7.1 05/23/2012   HGB 15.1 05/23/2012   HCT 44.0 05/23/2012   PLT 233 05/23/2012   GLUCOSE 117* 05/23/2012   ALKPHOS 88 05/23/2012   ALT 27 05/23/2012   AST 21 05/23/2012   NA 137 05/23/2012   K 4.1 05/23/2012   CL 106 05/23/2012   CREATININE 1.2  05/23/2012   BUN 8.0 05/23/2012   CO2 24 05/23/2012    ASSESSMENT AND PLAN:  1. History of oropharyngeal squamous cell carcinoma: I discussed with Mr. Neals that there is no evidence of recurrent or metastatic disease per clinical history, laboratory tests. He has not been drinking, however, he smokes 4 cigarettes per day intermittently. He is not ready to quit. States he has tried multiple methods to quit. Does not want a referral for a smoking cessation class at this time. As he is 2 years out from the finish of therapy, there is no indication for routine surveillance neck CT unless he develops concerning symptoms. He has not seen ENT recently and I have made a referral for him to be seen by Dr Annalee Genta within the next 1-2 months. 2. Follow up: RV with [place] in 6 months.  3. 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 He is on levothyroxine 25 mcg p.o. daily. His TSH is within normal range. I did not adjust the dose of levothyroxine today.  4. History of slight liver function test abnormality. This is resolved at this time.  5. Age-appropriate colon cancer screening:  Per his report, his colonoscopy last year was negative.  His next one is due when he turns 60.    I encouraged him to see Dr. Osborn Coho and Dr. Margaretmary Dys in between visits with [place] here. I advised him to contact us with concerning symptoms.      The length of time of the face-to-face encounter was 15  minutes. More than 50% of time was spent counseling and coordination of care.

## 2012-05-23 NOTE — Patient Instructions (Addendum)
Follow up:  - every 6 months visit with Medical Oncology. Since you're 2 years out from finish of therapy; there is no indication for routine neck CT unless you have symptoms.  - Please make sure that you also follow up with ENT and Radiation Oncology to have flexible laryngoscopy by these doctors on a routine basis.

## 2012-05-23 NOTE — Telephone Encounter (Signed)
Gave patient appt for May 2014 lab and MD, pt will see Dr. Annalee Genta on 06/21/12 @ 4:30pm, pt aware of appt

## 2012-11-21 ENCOUNTER — Ambulatory Visit (HOSPITAL_BASED_OUTPATIENT_CLINIC_OR_DEPARTMENT_OTHER): Payer: BC Managed Care – PPO | Admitting: Oncology

## 2012-11-21 ENCOUNTER — Telehealth: Payer: Self-pay | Admitting: *Deleted

## 2012-11-21 ENCOUNTER — Other Ambulatory Visit (HOSPITAL_BASED_OUTPATIENT_CLINIC_OR_DEPARTMENT_OTHER): Payer: BC Managed Care – PPO

## 2012-11-21 ENCOUNTER — Telehealth: Payer: Self-pay | Admitting: Oncology

## 2012-11-21 VITALS — BP 121/74 | HR 65 | Temp 97.8°F | Resp 19 | Ht 70.0 in | Wt 188.6 lb

## 2012-11-21 DIAGNOSIS — D490 Neoplasm of unspecified behavior of digestive system: Secondary | ICD-10-CM

## 2012-11-21 DIAGNOSIS — E039 Hypothyroidism, unspecified: Secondary | ICD-10-CM

## 2012-11-21 DIAGNOSIS — C099 Malignant neoplasm of tonsil, unspecified: Secondary | ICD-10-CM

## 2012-11-21 DIAGNOSIS — B977 Papillomavirus as the cause of diseases classified elsewhere: Secondary | ICD-10-CM

## 2012-11-21 DIAGNOSIS — E038 Other specified hypothyroidism: Secondary | ICD-10-CM

## 2012-11-21 LAB — CBC WITH DIFFERENTIAL/PLATELET
BASO%: 0.8 % (ref 0.0–2.0)
Basophils Absolute: 0.1 10*3/uL (ref 0.0–0.1)
EOS%: 1.4 % (ref 0.0–7.0)
Eosinophils Absolute: 0.1 10*3/uL (ref 0.0–0.5)
HCT: 44.2 % (ref 38.4–49.9)
HGB: 14.9 g/dL (ref 13.0–17.1)
LYMPH%: 12.4 % — ABNORMAL LOW (ref 14.0–49.0)
MCH: 31.2 pg (ref 27.2–33.4)
MCHC: 33.7 g/dL (ref 32.0–36.0)
MCV: 92.7 fL (ref 79.3–98.0)
MONO#: 0.8 10*3/uL (ref 0.1–0.9)
MONO%: 10 % (ref 0.0–14.0)
NEUT#: 6.3 10*3/uL (ref 1.5–6.5)
NEUT%: 75.4 % — ABNORMAL HIGH (ref 39.0–75.0)
Platelets: 234 10*3/uL (ref 140–400)
RBC: 4.77 10*6/uL (ref 4.20–5.82)
RDW: 13.6 % (ref 11.0–14.6)
WBC: 8.3 10*3/uL (ref 4.0–10.3)
lymph#: 1 10*3/uL (ref 0.9–3.3)

## 2012-11-21 LAB — COMPREHENSIVE METABOLIC PANEL (CC13)
ALT: 17 U/L (ref 0–55)
AST: 16 U/L (ref 5–34)
Albumin: 3.3 g/dL — ABNORMAL LOW (ref 3.5–5.0)
Alkaline Phosphatase: 89 U/L (ref 40–150)
BUN: 11.3 mg/dL (ref 7.0–26.0)
CO2: 25 mEq/L (ref 22–29)
Calcium: 9.2 mg/dL (ref 8.4–10.4)
Chloride: 106 mEq/L (ref 98–107)
Creatinine: 1.2 mg/dL (ref 0.7–1.3)
Glucose: 94 mg/dl (ref 70–99)
Potassium: 4.3 mEq/L (ref 3.5–5.1)
Sodium: 140 mEq/L (ref 136–145)
Total Bilirubin: 0.49 mg/dL (ref 0.20–1.20)
Total Protein: 7 g/dL (ref 6.4–8.3)

## 2012-11-21 NOTE — Telephone Encounter (Signed)
Lab called to notify that they missed order for TSH today.  I called pt to request he come back in for lab to have TSH drawn.  Pt can come in Wed morning before work.  POF sent for lab on 5/14 at 8:15 am.

## 2012-11-21 NOTE — Progress Notes (Signed)
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   PAST THERAPY: s/p definitive cisplatin/XRT finished in Feb 2011. He achieved complete response.   CURRENT THERAPY: watchful observation.   INTERVAL HISTORY: Randy Pham 52 y.o. male returns for regular follow up by himself.  He still smokes cigarettes about 1 pack a week. He does not chew tobacco or drink alcohol. He is working full time. He denies any fatigue. He denies any lymph node swelling in the neck or dysphagia odynophagia. He does have mild residual xerostomia and has some problem with dry foods such as chicken or bread.  The rest of the 14 point review of system was negative.   Past Medical History  Diagnosis Date  . Hyperlipidemia   . Hypothyroidism, secondary   . Tonsil neoplasm     cT1, N2b, M0; HPV +  . S/P radiation therapy 07/15/09 - 08/30/09    Tonsillar Cancer : 79 Gy, 59.4 Gy and 54 Gy in 33 Fractions  . Status post chemotherapy comp. 08/2009    Cisplatin Concurrently with Radiation  . Xerostomia     S/P Radiation Therapy f    No past surgical history on file.  Current Outpatient Prescriptions  Medication Sig Dispense Refill  . levothyroxine (SYNTHROID, LEVOTHROID) 25 MCG tablet TAKE ONE TABLET BY MOUTH DAILY  90 tablet  3  . omeprazole (PRILOSEC) 20 MG capsule Take 20 mg by mouth daily.       No current facility-administered medications for this visit.    ALLERGIES:  is allergic to metronidazole hcl.  REVIEW OF SYSTEMS:  The rest of the 14-point review of system was negative.   Filed Vitals:   11/21/12 0821  BP: 121/74  Pulse: 65  Temp: 97.8 F (36.6 C)  Resp: 19   Wt Readings from Last 3 Encounters:  11/21/12 188 lb 9.6 oz (85.548 kg)  05/23/12 188 lb (85.276 kg)  03/03/12 180 lb 8 oz (81.874 kg)   ECOG  Performance status: 0  PHYSICAL EXAMINATION:   General:  well-nourished in no acute distress.  Eyes:  no scleral icterus.  ENT:  There were no oropharyngeal lesions.  Neck was without thyromegaly.  Lymphatics:  Negative cervical, supraclavicular or axillary adenopathy.  Respiratory: lungs were clear bilaterally without wheezing or crackles.  Cardiovascular:  Regular rate and rhythm, S1/S2, without murmur, rub or gallop.  There was no pedal edema.  GI:  abdomen was soft, flat, nontender, nondistended, without organomegaly.  Muscoloskeletal:  no spinal tenderness of palpation of vertebral spine.  Skin exam was without echymosis, petichae.  Neuro exam was nonfocal.  Patient was able to get on and off exam table without assistance.  Gait was normal.  Patient was alerted and oriented.  Attention was good.   Language was appropriate.  Mood was normal without depression.  Speech was not pressured.  Thought content was not tangential.       LABORATORY/RADIOLOGY DATA:  Lab Results  Component Value Date   WBC 8.3 11/21/2012   HGB 14.9 11/21/2012   HCT 44.2 11/21/2012   PLT 234 11/21/2012   GLUCOSE 94 11/21/2012   ALKPHOS 89 11/21/2012   ALT 17 11/21/2012   AST 16 11/21/2012   NA 140 11/21/2012   K 4.3 11/21/2012   CL 106 11/21/2012   CREATININE 1.2 11/21/2012  BUN 11.3 11/21/2012   CO2 25 11/21/2012     ASSESSMENT AND PLAN:  1. History of oropharyngeal squamous cell carcinoma: Continues to be in remission. I strongly advised him to discontinue smoking as it can increase the risk of recurrent cancer.  2. Follow up: RV with [place] in 1 year.  3. 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 He is on levothyroxine 25 mcg p.o. daily. His TSH is pending today. I will adjust dose of levothyroxine as needed. 4. History of slight liver function test abnormality. This is resolved at this time.  5. Age-appropriate colon cancer screening:  Per his report, his colonoscopy last year was negative.  His next one is due when he  turns 60.    I encouraged him to see Dr. Osborn Coho and Dr. Margaretmary Dys in between visits with [place] here. I advised him to contact us with concerning symptoms.   I informed the patient am leaving the practice. The cancer Center will arrange for him to followup with a new provider when he returns in one year.   The length of time of the face-to-face encounter was 10 minutes. More than 50% of time was spent counseling and coordination of care.     Aryan Sparks T. Gaylyn Rong, M.D.

## 2012-11-21 NOTE — Telephone Encounter (Signed)
gv and printeda ppt sched and avs for pt for 2015...Marland Kitchenpt ok and aware

## 2012-11-22 ENCOUNTER — Other Ambulatory Visit: Payer: Self-pay | Admitting: Oncology

## 2012-11-22 ENCOUNTER — Telehealth: Payer: Self-pay | Admitting: Oncology

## 2012-11-22 DIAGNOSIS — E038 Other specified hypothyroidism: Secondary | ICD-10-CM

## 2012-11-22 NOTE — Telephone Encounter (Signed)
Yearly TSH is fine.

## 2012-11-23 ENCOUNTER — Other Ambulatory Visit: Payer: BC Managed Care – PPO | Admitting: Lab

## 2012-11-23 DIAGNOSIS — E038 Other specified hypothyroidism: Secondary | ICD-10-CM

## 2012-11-23 LAB — TSH: TSH: 6.233 u[IU]/mL — ABNORMAL HIGH (ref 0.350–4.500)

## 2012-11-24 ENCOUNTER — Telehealth: Payer: Self-pay | Admitting: Oncology

## 2012-11-24 ENCOUNTER — Other Ambulatory Visit: Payer: Self-pay | Admitting: Oncology

## 2012-11-24 ENCOUNTER — Telehealth: Payer: Self-pay | Admitting: *Deleted

## 2012-11-24 DIAGNOSIS — E038 Other specified hypothyroidism: Secondary | ICD-10-CM

## 2012-11-24 MED ORDER — LEVOTHYROXINE SODIUM 50 MCG PO TABS: 50.0000 ug | ORAL_TABLET | Freq: Every day | ORAL | Status: DC

## 2012-11-24 NOTE — Telephone Encounter (Signed)
Message copied by Wende Mott on Thu Nov 24, 2012  1:28 PM ------      Message from: HA, Raliegh Ip T      Created: Thu Nov 24, 2012  1:02 PM      Regarding: RE: TSH               Please call pt and advise him to increase Synthroid from 25 now to 50 mcg a day. I will put POF for him to check lab tests in 4 and 8 months for thyroid level.            ----- Message -----         From: Marlowe Aschoff, RN         Sent: 11/24/2012  12:43 PM           To: Exie Parody, MD      Subject: TSH                                                      Any new instructions for pt regarding his TSH results?  I need to call him w/ results.  Still yearly TSH or does he need rechecked any sooner.  Thanks.       ------

## 2012-11-24 NOTE — Telephone Encounter (Signed)
Left VM for pt on cell phone informing him of increase Synthroid from 25 mcg daily to 50 mcg daily. New rx sent to [facility] and labs to be rechecked in 4 months.  Asked him to return call if any questions.

## 2013-03-02 ENCOUNTER — Ambulatory Visit: Payer: BC Managed Care – PPO | Admitting: Radiation Oncology

## 2013-03-09 ENCOUNTER — Ambulatory Visit: Payer: BC Managed Care – PPO | Admitting: Radiation Oncology

## 2013-03-23 ENCOUNTER — Encounter: Payer: Self-pay | Admitting: Radiation Oncology

## 2013-03-23 ENCOUNTER — Ambulatory Visit (HOSPITAL_COMMUNITY)
Admission: RE | Admit: 2013-03-23 | Discharge: 2013-03-23 | Disposition: A | Discharge status: Home / Self Care | Payer: BC Managed Care – PPO | Source: Ambulatory Visit | Attending: Radiation Oncology | Admitting: Radiation Oncology

## 2013-03-23 ENCOUNTER — Ambulatory Visit
Admission: RE | Admit: 2013-03-23 | Discharge: 2013-03-23 | Disposition: A | Discharge status: Home / Self Care | Payer: BC Managed Care – PPO | Source: Ambulatory Visit | Attending: Radiation Oncology | Admitting: Radiation Oncology

## 2013-03-23 ENCOUNTER — Telehealth: Payer: Self-pay | Admitting: *Deleted

## 2013-03-23 VITALS — BP 128/78 | HR 58 | Temp 98.0°F | Resp 18 | Wt 185.2 lb

## 2013-03-23 DIAGNOSIS — C099 Malignant neoplasm of tonsil, unspecified: Secondary | ICD-10-CM | POA: Insufficient documentation

## 2013-03-23 DIAGNOSIS — D490 Neoplasm of unspecified behavior of digestive system: Secondary | ICD-10-CM

## 2013-03-23 DIAGNOSIS — Z87891 Personal history of nicotine dependence: Secondary | ICD-10-CM | POA: Insufficient documentation

## 2013-03-23 DIAGNOSIS — E038 Other specified hypothyroidism: Secondary | ICD-10-CM

## 2013-03-23 NOTE — Progress Notes (Signed)
Quick Note:  Please call patient with normal result.  Thanks. MM ______ 

## 2013-03-23 NOTE — Progress Notes (Signed)
Scheduled to see ENT next month. Dry mouth continues but, is manageable. Intermittent right ear ache and left neck stiffness. Denies difficult or painful swallowing. Denies cough or shortness of breath. Denies headache, dizziness, nausea, or vomiting. Denies night sweats or weight loss. Last ct of neck done 11/18/2011.

## 2013-03-23 NOTE — Assessment & Plan Note (Signed)
S/p chemoRT 2010

## 2013-03-23 NOTE — Telephone Encounter (Signed)
Pt called to ask about lab appt scheduled for 9/15.  States he was just at [facility] this morning to see Dr. Kathrynn Running and no one told him about his lab appt for Monday.  He does not know why he has lab scheduled.  Reminded pt that when his thyroid medication was changed in May we informed him of lab needed in 4 months to recheck his thyroid level.  Informed pt we can change lab appt if needed, but it is necessary to check TSH while he is taking synthroid.  Pt verbalized understanding and states he can come in at 8 am on 9/29.  Sent order to Scheduler to change lab and confirm w/ pt.Randy Pham

## 2013-03-23 NOTE — Progress Notes (Signed)
  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  Scheduled to see ENT next month. Dry mouth continues but, is manageable. Intermittent right ear ache and left neck stiffness. Denies difficult or painful swallowing. Denies cough or shortness of breath. Denies headache, dizziness, nausea, or vomiting. Denies night sweats or weight loss. Last ct of neck done 11/18/2011                           ALLERGIES:  is allergic to metronidazole hcl.  Meds: Current Outpatient Prescriptions  Medication Sig Dispense Refill  . levothyroxine (SYNTHROID) 50 MCG tablet Take 1 tablet (50 mcg total) by mouth daily before breakfast.  30 tablet  3  . omeprazole (PRILOSEC) 20 MG capsule Take 20 mg by mouth daily.       No current facility-administered medications for this encounter.    Physical Findings: The patient is in no acute distress. Patient is alert and oriented.  weight is 185 lb 3.2 oz (84.006 kg). His oral temperature is 98 F (36.7 C). His blood pressure is 128/78 and his pulse is 58. His respiration is 18 and oxygen saturation is 100%. .  Neck is free of lymphadenopathy. Oral cavity contains good dentition. There are scattered to inject headache changes of the mucosa of the posterior oropharynx but no mucosal irregularities to suggest recurrent or persistent disease. Indirect nares exam shows no abnormalities the posterior oropharynx or larynx. Vocal cords are pale and smooth and and and symmetrically upon phonation. No significant changes.  Lab Findings: Lab Results  Component Value Date   WBC 8.3 11/21/2012   HGB  14.9 11/21/2012   HCT 44.2 11/21/2012   MCV 92.7 11/21/2012   PLT 234 11/21/2012    Impression:  The patient is without evidence of disease.  Plan:  Annual CXR today then follow-up in 1 year.  _____________________________________  Artist Pais. Kathrynn Running, M.D.

## 2013-03-24 ENCOUNTER — Telehealth: Payer: Self-pay

## 2013-03-24 NOTE — Telephone Encounter (Signed)
Patient returned call.Informed patient that chest xray was normal per Dr.Wentworth.

## 2013-03-24 NOTE — Telephone Encounter (Signed)
Called to inform of cxr results, no answer .Message left to return my call.

## 2013-03-27 ENCOUNTER — Other Ambulatory Visit: Payer: BC Managed Care – PPO | Admitting: Lab

## 2013-04-04 ENCOUNTER — Telehealth: Payer: Self-pay | Admitting: Radiation Oncology

## 2013-04-04 NOTE — Telephone Encounter (Signed)
Phoned patient to provided chest xray results. No answer. Left message requesting return call.

## 2013-04-04 NOTE — Telephone Encounter (Signed)
Patient returning this writer's message. Patient very upbeat. Verbalized normal results of 03/23/2013 chest xray. Patient verbalized understanding and expressed appreciation for the call.

## 2013-04-10 ENCOUNTER — Ambulatory Visit (HOSPITAL_BASED_OUTPATIENT_CLINIC_OR_DEPARTMENT_OTHER): Payer: BC Managed Care – PPO | Admitting: Lab

## 2013-04-10 ENCOUNTER — Telehealth: Payer: Self-pay | Admitting: Oncology

## 2013-04-10 ENCOUNTER — Other Ambulatory Visit: Payer: BC Managed Care – PPO | Admitting: Lab

## 2013-04-10 ENCOUNTER — Other Ambulatory Visit: Payer: Self-pay | Admitting: Hematology and Oncology

## 2013-04-10 DIAGNOSIS — C099 Malignant neoplasm of tonsil, unspecified: Secondary | ICD-10-CM

## 2013-04-10 DIAGNOSIS — E038 Other specified hypothyroidism: Secondary | ICD-10-CM

## 2013-04-10 DIAGNOSIS — E039 Hypothyroidism, unspecified: Secondary | ICD-10-CM

## 2013-04-10 NOTE — Telephone Encounter (Signed)
pt called to r/s lab back to today...done

## 2013-04-10 NOTE — Telephone Encounter (Signed)
pt called to r/s missed lab...done °

## 2013-04-11 ENCOUNTER — Other Ambulatory Visit: Payer: BC Managed Care – PPO

## 2013-04-11 LAB — TSH CHCC: TSH: 2.288 m(IU)/L (ref 0.320–4.118)

## 2013-04-11 LAB — T4, FREE: Free T4: 1.15 ng/dL (ref 0.80–1.80)

## 2013-04-21 ENCOUNTER — Telehealth: Payer: Self-pay | Admitting: *Deleted

## 2013-04-21 NOTE — Telephone Encounter (Signed)
Pt called for TSH results.  They were wnl and instructed to continue same dose of Synthroid.  He verbalized understanding.

## 2013-05-03 ENCOUNTER — Other Ambulatory Visit: Payer: Self-pay | Admitting: *Deleted

## 2013-05-03 MED ORDER — LEVOTHYROXINE SODIUM 50 MCG PO TABS: 50.0000 ug | ORAL_TABLET | Freq: Every day | ORAL | Status: DC

## 2013-05-03 NOTE — Telephone Encounter (Signed)
Pt left VM requesting refill on Synthroid for 90 day supply be sent to [facility].   Refill sent.

## 2013-06-20 ENCOUNTER — Telehealth: Payer: Self-pay | Admitting: *Deleted

## 2013-06-20 NOTE — Telephone Encounter (Signed)
sw pt informed her that her arrival time changed. gv appt for 07/31/13 w/ labs@ 12 noon and ov@ 12:30pm. Pt is aware...td

## 2013-06-30 ENCOUNTER — Telehealth: Payer: Self-pay | Admitting: *Deleted

## 2013-06-30 ENCOUNTER — Telehealth: Payer: Self-pay | Admitting: Hematology and Oncology

## 2013-06-30 ENCOUNTER — Other Ambulatory Visit: Payer: Self-pay | Admitting: Hematology and Oncology

## 2013-06-30 DIAGNOSIS — C099 Malignant neoplasm of tonsil, unspecified: Secondary | ICD-10-CM

## 2013-06-30 DIAGNOSIS — E038 Other specified hypothyroidism: Secondary | ICD-10-CM

## 2013-06-30 NOTE — Telephone Encounter (Signed)
Left Vm for pt informing him of order to r/s his January appts to May.  Asked him to call back if any questions.

## 2013-06-30 NOTE — Telephone Encounter (Signed)
Pt asking if he needs to keep office visit w/ Dr. Bertis Ruddy as scheduled in January?  States Dr. Gaylyn Rong told him on last visit in May 2014 that he did not need to return for one year.  Pt saw Dr. Kathrynn Running in Sept 2014 and Dr. Annalee Genta in October 2014.    Pt asks if ok to wait until May to see Dr. Bertis Ruddy?

## 2013-06-30 NOTE — Telephone Encounter (Signed)
I placed lab order and RV order to be moved to May  Thanks

## 2013-06-30 NOTE — Telephone Encounter (Signed)
lmonvm for pt re cx 07/31/13 appt and new appt for 12/26/13. schedule mailed.

## 2013-07-31 ENCOUNTER — Ambulatory Visit: Payer: BC Managed Care – PPO | Admitting: Hematology and Oncology

## 2013-07-31 ENCOUNTER — Ambulatory Visit: Payer: BC Managed Care – PPO

## 2013-07-31 ENCOUNTER — Other Ambulatory Visit: Payer: BC Managed Care – PPO

## 2013-07-31 ENCOUNTER — Other Ambulatory Visit: Payer: BC Managed Care – PPO | Admitting: Lab

## 2013-09-26 ENCOUNTER — Telehealth: Payer: Self-pay | Admitting: *Deleted

## 2013-09-26 NOTE — Telephone Encounter (Signed)
Message copied by Cathlean Cower on Tue Sep 26, 2013  3:55 PM ------      Message from: [facility] [place]: Thu Sep 21, 2013  9:27 AM      Regarding: RE: Follow up and Synthroid       Please tell pt he needs to see PCP to get his Synthroid refilled.      That would be the cheapest for him      I am comfortable pt follows with Dr. Wilburn Cornelia only from now on      Thanks      ----- Message -----         From: Cathlean Cower, RN         Sent: 09/21/2013   8:58 AM           To: Heath Lark, MD      Subject: Follow up and Synthroid                                  Looks like pt sees Dr. Tammi Klippel yearly and Dr. Wilburn Cornelia q 6 months.  We have been managing his Synthroid,  So does he need to see you to continue this?         I haven't spoke to pt yet, but remember from the past that he likes as few "co-pays" as possible.       Thanks.        ------

## 2013-09-26 NOTE — Telephone Encounter (Signed)
Left VM for pt to call nurse regarding recommendations for follow up per Dr. Calton Dach message below.

## 2013-09-29 ENCOUNTER — Telehealth: Payer: Self-pay | Admitting: *Deleted

## 2013-09-29 NOTE — Telephone Encounter (Signed)
S/w pt and informed him of no need to f/u with Dr. Alvy Bimler.  We will cancel his appts in June.  He needs to continue regular f/u w/ Dr. Wilburn Cornelia and with Dr. Tammi Klippel per Dr. Johny Shears recommendation which is yearly according to his last office note.  Instructed pt to have his PCP,  Dr. Rex Kras, manage his Synthroid.  Pt states has not seen Dr. Rex Kras in a long time.  Instructed pt to make appt to re establish care with his PCP.  Encouraged pt to see his PCP at least yearly or as recommended to manage his thyroid medication.  Pt verbalized understanding.

## 2013-11-20 ENCOUNTER — Ambulatory Visit: Payer: BC Managed Care – PPO

## 2013-11-20 ENCOUNTER — Other Ambulatory Visit: Payer: BC Managed Care – PPO | Admitting: Lab

## 2013-11-22 ENCOUNTER — Other Ambulatory Visit: Payer: BC Managed Care – PPO | Admitting: Lab

## 2013-11-22 ENCOUNTER — Ambulatory Visit: Payer: BC Managed Care – PPO | Admitting: Oncology

## 2013-12-29 ENCOUNTER — Other Ambulatory Visit: Payer: BC Managed Care – PPO

## 2013-12-29 ENCOUNTER — Ambulatory Visit: Payer: BC Managed Care – PPO | Admitting: Hematology and Oncology

## 2014-03-22 ENCOUNTER — Ambulatory Visit: Payer: Self-pay | Admitting: Radiation Oncology

## 2014-03-25 ENCOUNTER — Other Ambulatory Visit: Payer: Self-pay | Admitting: Hematology and Oncology

## 2014-04-12 ENCOUNTER — Encounter: Payer: Self-pay | Admitting: Radiation Oncology

## 2014-04-12 ENCOUNTER — Ambulatory Visit
Admission: RE | Admit: 2014-04-12 | Discharge: 2014-04-12 | Disposition: A | Discharge status: Home / Self Care | Payer: BC Managed Care – PPO | Source: Ambulatory Visit | Attending: Radiation Oncology | Admitting: Radiation Oncology

## 2014-04-12 VITALS — BP 122/63 | HR 65 | Resp 16 | Wt 188.0 lb

## 2014-04-12 DIAGNOSIS — C099 Malignant neoplasm of tonsil, unspecified: Secondary | ICD-10-CM

## 2014-04-12 NOTE — Progress Notes (Signed)
  Radiation Oncology         [phone] ________________________________  Name: Randy Pham MRN: [id number]  Date: 04/12/2014  DOB: August 01, 1960    Follow-Up Visit Note  CC: Gennette Pac, MD  Hulan Fess, MD  Diagnosis:   53 yo man with Stage T1 N2b Squamous Cell Carcinoma of the Tonsil - Stage IVA s/p chemoRT with Dr. Rolena Infante in 1/3-2/18/2011   Interval Since Last Radiation:  4 1/2  years  Narrative:  The patient returns today for routine follow-up.  Patient denies pain. Weight and vitals stable. Patient denies cough or shortness of breath. Reports dry mouth and throat worse at night. Denies difficulty swallowing. Denies headache, dizziness, nausea or vomiting                            ALLERGIES:  is allergic to metronidazole hcl.  Meds: Current Outpatient Prescriptions  Medication Sig Dispense Refill  . levothyroxine (SYNTHROID, LEVOTHROID) 50 MCG tablet TAKE 1 TABLET BY MOUTH DAILY BEFORE BREAKFAST.  90 tablet  0  . omeprazole (PRILOSEC) 20 MG capsule Take 20 mg by mouth daily.       No current facility-administered medications for this encounter.    Physical Findings: The patient is in no acute distress. Patient is alert and oriented.  weight is 188 lb (85.276 kg). His blood pressure is 122/63 and his pulse is 65. His respiration is 16 and oxygen saturation is 99%. .  Neck is free of lymphadenopathy. Oral cavity contains good dentition. There are scattered telangiectatic changes of the mucosa of the posterior oropharynx but no mucosal irregularities to suggest recurrent or persistent disease. Indirect mirro exam shows no abnormalities the posterior oropharynx or larynx. Vocal cords are pale and smooth and and and symmetrically upon phonation. No significant changes  Lab Findings: Lab Results  Component Value Date   WBC 8.3 11/21/2012   HGB 14.9 11/21/2012   HCT 44.2 11/21/2012   MCV 92.7 11/21/2012   PLT 234 11/21/2012   Impression:  The patient is recovering from  the effects of radiation.  He has no evidence of recurrence  Plan:  The patient plans to continue follow-up with ENT.  I will plan to see him on an as needed basis.    _____________________________________  Sheral Apley Tammi Klippel, M.D.

## 2014-04-12 NOTE — Progress Notes (Signed)
Patient denies pain. Weight and vitals stable. Patient denies cough or shortness of breath. Reports dry mouth and throat worse at night. Denies difficulty swallowing. Denies headache, dizziness, nausea or vomiting.

## 2014-07-01 ENCOUNTER — Other Ambulatory Visit: Payer: Self-pay | Admitting: Hematology and Oncology

## 2016-01-28 DIAGNOSIS — R972 Elevated prostate specific antigen [PSA]: Secondary | ICD-10-CM | POA: Diagnosis not present

## 2016-01-28 DIAGNOSIS — R739 Hyperglycemia, unspecified: Secondary | ICD-10-CM | POA: Diagnosis not present

## 2016-01-28 DIAGNOSIS — E559 Vitamin D deficiency, unspecified: Secondary | ICD-10-CM | POA: Diagnosis not present

## 2016-01-28 DIAGNOSIS — E785 Hyperlipidemia, unspecified: Secondary | ICD-10-CM | POA: Diagnosis not present

## 2016-01-28 DIAGNOSIS — E039 Hypothyroidism, unspecified: Secondary | ICD-10-CM | POA: Diagnosis not present

## 2016-04-14 DIAGNOSIS — R972 Elevated prostate specific antigen [PSA]: Secondary | ICD-10-CM | POA: Diagnosis not present

## 2016-06-02 DIAGNOSIS — R739 Hyperglycemia, unspecified: Secondary | ICD-10-CM | POA: Diagnosis not present

## 2016-06-02 DIAGNOSIS — E039 Hypothyroidism, unspecified: Secondary | ICD-10-CM | POA: Diagnosis not present

## 2016-06-02 DIAGNOSIS — E785 Hyperlipidemia, unspecified: Secondary | ICD-10-CM | POA: Diagnosis not present

## 2016-06-02 DIAGNOSIS — C4490 Unspecified malignant neoplasm of skin, unspecified: Secondary | ICD-10-CM | POA: Diagnosis not present

## 2016-06-02 DIAGNOSIS — E559 Vitamin D deficiency, unspecified: Secondary | ICD-10-CM | POA: Diagnosis not present

## 2016-06-02 DIAGNOSIS — C140 Malignant neoplasm of pharynx, unspecified: Secondary | ICD-10-CM | POA: Diagnosis not present

## 2016-07-28 DIAGNOSIS — R972 Elevated prostate specific antigen [PSA]: Secondary | ICD-10-CM | POA: Diagnosis not present

## 2016-08-18 DIAGNOSIS — L905 Scar conditions and fibrosis of skin: Secondary | ICD-10-CM | POA: Diagnosis not present

## 2016-08-18 DIAGNOSIS — L439 Lichen planus, unspecified: Secondary | ICD-10-CM | POA: Diagnosis not present

## 2016-08-18 DIAGNOSIS — L573 Poikiloderma of Civatte: Secondary | ICD-10-CM | POA: Diagnosis not present

## 2016-08-18 DIAGNOSIS — L821 Other seborrheic keratosis: Secondary | ICD-10-CM | POA: Diagnosis not present

## 2016-08-18 DIAGNOSIS — D225 Melanocytic nevi of trunk: Secondary | ICD-10-CM | POA: Diagnosis not present

## 2016-08-18 DIAGNOSIS — D485 Neoplasm of uncertain behavior of skin: Secondary | ICD-10-CM | POA: Diagnosis not present

## 2016-08-18 DIAGNOSIS — L570 Actinic keratosis: Secondary | ICD-10-CM | POA: Diagnosis not present

## 2016-08-25 DIAGNOSIS — R972 Elevated prostate specific antigen [PSA]: Secondary | ICD-10-CM | POA: Diagnosis not present

## 2016-10-06 DIAGNOSIS — F428 Other obsessive-compulsive disorder: Secondary | ICD-10-CM | POA: Diagnosis not present

## 2016-12-01 DIAGNOSIS — R739 Hyperglycemia, unspecified: Secondary | ICD-10-CM | POA: Diagnosis not present

## 2016-12-01 DIAGNOSIS — C4490 Unspecified malignant neoplasm of skin, unspecified: Secondary | ICD-10-CM | POA: Diagnosis not present

## 2016-12-01 DIAGNOSIS — E785 Hyperlipidemia, unspecified: Secondary | ICD-10-CM | POA: Diagnosis not present

## 2016-12-01 DIAGNOSIS — M791 Myalgia: Secondary | ICD-10-CM | POA: Diagnosis not present

## 2016-12-01 DIAGNOSIS — C140 Malignant neoplasm of pharynx, unspecified: Secondary | ICD-10-CM | POA: Diagnosis not present

## 2016-12-01 DIAGNOSIS — E039 Hypothyroidism, unspecified: Secondary | ICD-10-CM | POA: Diagnosis not present

## 2017-02-09 DIAGNOSIS — R972 Elevated prostate specific antigen [PSA]: Secondary | ICD-10-CM | POA: Diagnosis not present

## 2017-03-02 DIAGNOSIS — R972 Elevated prostate specific antigen [PSA]: Secondary | ICD-10-CM | POA: Diagnosis not present

## 2017-03-24 DIAGNOSIS — M79672 Pain in left foot: Secondary | ICD-10-CM | POA: Diagnosis not present

## 2017-03-24 DIAGNOSIS — M79671 Pain in right foot: Secondary | ICD-10-CM | POA: Diagnosis not present

## 2017-03-24 DIAGNOSIS — M21962 Unspecified acquired deformity of left lower leg: Secondary | ICD-10-CM | POA: Diagnosis not present

## 2017-03-24 DIAGNOSIS — M722 Plantar fascial fibromatosis: Secondary | ICD-10-CM | POA: Diagnosis not present

## 2017-06-18 DIAGNOSIS — E785 Hyperlipidemia, unspecified: Secondary | ICD-10-CM | POA: Diagnosis not present

## 2017-06-18 DIAGNOSIS — E559 Vitamin D deficiency, unspecified: Secondary | ICD-10-CM | POA: Diagnosis not present

## 2017-06-18 DIAGNOSIS — E039 Hypothyroidism, unspecified: Secondary | ICD-10-CM | POA: Diagnosis not present

## 2017-06-18 DIAGNOSIS — R7303 Prediabetes: Secondary | ICD-10-CM | POA: Diagnosis not present

## 2017-06-18 DIAGNOSIS — Z0000 Encounter for general adult medical examination without abnormal findings: Secondary | ICD-10-CM | POA: Diagnosis not present

## 2017-07-27 DIAGNOSIS — Z86010 Personal history of colonic polyps: Secondary | ICD-10-CM | POA: Diagnosis not present

## 2017-07-27 DIAGNOSIS — Z01818 Encounter for other preprocedural examination: Secondary | ICD-10-CM | POA: Diagnosis not present

## 2017-08-27 DIAGNOSIS — R972 Elevated prostate specific antigen [PSA]: Secondary | ICD-10-CM | POA: Diagnosis not present

## 2017-08-31 DIAGNOSIS — R972 Elevated prostate specific antigen [PSA]: Secondary | ICD-10-CM | POA: Diagnosis not present

## 2017-11-16 DIAGNOSIS — D485 Neoplasm of uncertain behavior of skin: Secondary | ICD-10-CM | POA: Diagnosis not present

## 2017-11-16 DIAGNOSIS — L570 Actinic keratosis: Secondary | ICD-10-CM | POA: Diagnosis not present

## 2017-11-16 DIAGNOSIS — D1801 Hemangioma of skin and subcutaneous tissue: Secondary | ICD-10-CM | POA: Diagnosis not present

## 2017-11-16 DIAGNOSIS — L439 Lichen planus, unspecified: Secondary | ICD-10-CM | POA: Diagnosis not present

## 2017-11-16 DIAGNOSIS — L821 Other seborrheic keratosis: Secondary | ICD-10-CM | POA: Diagnosis not present

## 2017-11-16 DIAGNOSIS — D225 Melanocytic nevi of trunk: Secondary | ICD-10-CM | POA: Diagnosis not present

## 2017-12-14 DIAGNOSIS — K635 Polyp of colon: Secondary | ICD-10-CM | POA: Diagnosis not present

## 2017-12-14 DIAGNOSIS — Z980 Intestinal bypass and anastomosis status: Secondary | ICD-10-CM | POA: Diagnosis not present

## 2017-12-14 DIAGNOSIS — D126 Benign neoplasm of colon, unspecified: Secondary | ICD-10-CM | POA: Diagnosis not present

## 2017-12-14 DIAGNOSIS — K644 Residual hemorrhoidal skin tags: Secondary | ICD-10-CM | POA: Diagnosis not present

## 2017-12-14 DIAGNOSIS — Z86010 Personal history of colonic polyps: Secondary | ICD-10-CM | POA: Diagnosis not present

## 2017-12-14 DIAGNOSIS — K5730 Diverticulosis of large intestine without perforation or abscess without bleeding: Secondary | ICD-10-CM | POA: Diagnosis not present

## 2017-12-17 DIAGNOSIS — K635 Polyp of colon: Secondary | ICD-10-CM | POA: Diagnosis not present

## 2017-12-17 DIAGNOSIS — D126 Benign neoplasm of colon, unspecified: Secondary | ICD-10-CM | POA: Diagnosis not present

## 2017-12-21 DIAGNOSIS — R7303 Prediabetes: Secondary | ICD-10-CM | POA: Diagnosis not present

## 2017-12-21 DIAGNOSIS — E039 Hypothyroidism, unspecified: Secondary | ICD-10-CM | POA: Diagnosis not present

## 2017-12-21 DIAGNOSIS — E559 Vitamin D deficiency, unspecified: Secondary | ICD-10-CM | POA: Diagnosis not present

## 2017-12-21 DIAGNOSIS — E785 Hyperlipidemia, unspecified: Secondary | ICD-10-CM | POA: Diagnosis not present

## 2017-12-21 DIAGNOSIS — L570 Actinic keratosis: Secondary | ICD-10-CM | POA: Diagnosis not present

## 2018-03-15 DIAGNOSIS — R972 Elevated prostate specific antigen [PSA]: Secondary | ICD-10-CM | POA: Diagnosis not present

## 2018-04-08 DIAGNOSIS — H2513 Age-related nuclear cataract, bilateral: Secondary | ICD-10-CM | POA: Diagnosis not present

## 2018-04-08 DIAGNOSIS — H01021 Squamous blepharitis right upper eyelid: Secondary | ICD-10-CM | POA: Diagnosis not present

## 2018-04-08 DIAGNOSIS — H01022 Squamous blepharitis right lower eyelid: Secondary | ICD-10-CM | POA: Diagnosis not present

## 2018-04-08 DIAGNOSIS — H0289 Other specified disorders of eyelid: Secondary | ICD-10-CM | POA: Diagnosis not present

## 2018-06-21 DIAGNOSIS — Z0000 Encounter for general adult medical examination without abnormal findings: Secondary | ICD-10-CM | POA: Diagnosis not present

## 2018-06-21 DIAGNOSIS — E559 Vitamin D deficiency, unspecified: Secondary | ICD-10-CM | POA: Diagnosis not present

## 2018-06-21 DIAGNOSIS — E039 Hypothyroidism, unspecified: Secondary | ICD-10-CM | POA: Diagnosis not present

## 2018-06-21 DIAGNOSIS — Z125 Encounter for screening for malignant neoplasm of prostate: Secondary | ICD-10-CM | POA: Diagnosis not present

## 2018-06-21 DIAGNOSIS — E785 Hyperlipidemia, unspecified: Secondary | ICD-10-CM | POA: Diagnosis not present

## 2018-06-21 DIAGNOSIS — R7303 Prediabetes: Secondary | ICD-10-CM | POA: Diagnosis not present

## 2018-11-15 DIAGNOSIS — C140 Malignant neoplasm of pharynx, unspecified: Secondary | ICD-10-CM | POA: Diagnosis not present

## 2018-11-15 DIAGNOSIS — C4490 Unspecified malignant neoplasm of skin, unspecified: Secondary | ICD-10-CM | POA: Diagnosis not present

## 2019-01-03 DIAGNOSIS — E559 Vitamin D deficiency, unspecified: Secondary | ICD-10-CM | POA: Diagnosis not present

## 2019-01-03 DIAGNOSIS — R7303 Prediabetes: Secondary | ICD-10-CM | POA: Diagnosis not present

## 2019-01-03 DIAGNOSIS — E785 Hyperlipidemia, unspecified: Secondary | ICD-10-CM | POA: Diagnosis not present

## 2019-01-03 DIAGNOSIS — E039 Hypothyroidism, unspecified: Secondary | ICD-10-CM | POA: Diagnosis not present

## 2019-01-19 DIAGNOSIS — E785 Hyperlipidemia, unspecified: Secondary | ICD-10-CM | POA: Diagnosis not present

## 2019-01-19 DIAGNOSIS — R7303 Prediabetes: Secondary | ICD-10-CM | POA: Diagnosis not present

## 2019-01-19 DIAGNOSIS — E559 Vitamin D deficiency, unspecified: Secondary | ICD-10-CM | POA: Diagnosis not present

## 2019-01-19 DIAGNOSIS — E039 Hypothyroidism, unspecified: Secondary | ICD-10-CM | POA: Diagnosis not present

## 2019-02-28 DIAGNOSIS — E039 Hypothyroidism, unspecified: Secondary | ICD-10-CM | POA: Diagnosis not present

## 2019-03-07 DIAGNOSIS — L570 Actinic keratosis: Secondary | ICD-10-CM | POA: Diagnosis not present

## 2019-03-07 DIAGNOSIS — L814 Other melanin hyperpigmentation: Secondary | ICD-10-CM | POA: Diagnosis not present

## 2019-03-07 DIAGNOSIS — L821 Other seborrheic keratosis: Secondary | ICD-10-CM | POA: Diagnosis not present

## 2019-03-07 DIAGNOSIS — D225 Melanocytic nevi of trunk: Secondary | ICD-10-CM | POA: Diagnosis not present

## 2019-04-11 DIAGNOSIS — D485 Neoplasm of uncertain behavior of skin: Secondary | ICD-10-CM | POA: Diagnosis not present

## 2019-04-11 DIAGNOSIS — L820 Inflamed seborrheic keratosis: Secondary | ICD-10-CM | POA: Diagnosis not present

## 2019-04-29 DIAGNOSIS — Z20828 Contact with and (suspected) exposure to other viral communicable diseases: Secondary | ICD-10-CM | POA: Diagnosis not present

## 2019-05-10 DIAGNOSIS — Z20828 Contact with and (suspected) exposure to other viral communicable diseases: Secondary | ICD-10-CM | POA: Diagnosis not present

## 2019-05-29 DIAGNOSIS — Z20828 Contact with and (suspected) exposure to other viral communicable diseases: Secondary | ICD-10-CM | POA: Diagnosis not present

## 2019-06-13 DIAGNOSIS — Z20828 Contact with and (suspected) exposure to other viral communicable diseases: Secondary | ICD-10-CM | POA: Diagnosis not present

## 2019-07-03 DIAGNOSIS — Z20828 Contact with and (suspected) exposure to other viral communicable diseases: Secondary | ICD-10-CM | POA: Diagnosis not present

## 2019-07-17 DIAGNOSIS — Z20828 Contact with and (suspected) exposure to other viral communicable diseases: Secondary | ICD-10-CM | POA: Diagnosis not present

## 2019-07-31 DIAGNOSIS — Z20828 Contact with and (suspected) exposure to other viral communicable diseases: Secondary | ICD-10-CM | POA: Diagnosis not present

## 2019-08-14 DIAGNOSIS — Z20828 Contact with and (suspected) exposure to other viral communicable diseases: Secondary | ICD-10-CM | POA: Diagnosis not present

## 2019-11-16 DIAGNOSIS — E785 Hyperlipidemia, unspecified: Secondary | ICD-10-CM | POA: Diagnosis not present

## 2019-11-16 DIAGNOSIS — E039 Hypothyroidism, unspecified: Secondary | ICD-10-CM | POA: Diagnosis not present

## 2019-11-16 DIAGNOSIS — R7303 Prediabetes: Secondary | ICD-10-CM | POA: Diagnosis not present

## 2019-11-16 DIAGNOSIS — E559 Vitamin D deficiency, unspecified: Secondary | ICD-10-CM | POA: Diagnosis not present

## 2019-11-20 DIAGNOSIS — Z125 Encounter for screening for malignant neoplasm of prostate: Secondary | ICD-10-CM | POA: Diagnosis not present

## 2019-11-20 DIAGNOSIS — E559 Vitamin D deficiency, unspecified: Secondary | ICD-10-CM | POA: Diagnosis not present

## 2019-11-20 DIAGNOSIS — R7303 Prediabetes: Secondary | ICD-10-CM | POA: Diagnosis not present

## 2019-11-20 DIAGNOSIS — E785 Hyperlipidemia, unspecified: Secondary | ICD-10-CM | POA: Diagnosis not present

## 2020-02-26 DIAGNOSIS — Z20822 Contact with and (suspected) exposure to covid-19: Secondary | ICD-10-CM | POA: Diagnosis not present

## 2020-04-09 DIAGNOSIS — H0015 Chalazion left lower eyelid: Secondary | ICD-10-CM | POA: Diagnosis not present

## 2020-04-17 DIAGNOSIS — H2513 Age-related nuclear cataract, bilateral: Secondary | ICD-10-CM | POA: Diagnosis not present

## 2020-04-17 DIAGNOSIS — H0015 Chalazion left lower eyelid: Secondary | ICD-10-CM | POA: Diagnosis not present

## 2020-06-20 DIAGNOSIS — R7303 Prediabetes: Secondary | ICD-10-CM | POA: Diagnosis not present

## 2020-06-20 DIAGNOSIS — R972 Elevated prostate specific antigen [PSA]: Secondary | ICD-10-CM | POA: Diagnosis not present

## 2020-06-20 DIAGNOSIS — E559 Vitamin D deficiency, unspecified: Secondary | ICD-10-CM | POA: Diagnosis not present

## 2020-06-20 DIAGNOSIS — Z0000 Encounter for general adult medical examination without abnormal findings: Secondary | ICD-10-CM | POA: Diagnosis not present

## 2020-06-20 DIAGNOSIS — E785 Hyperlipidemia, unspecified: Secondary | ICD-10-CM | POA: Diagnosis not present

## 2020-06-20 DIAGNOSIS — E039 Hypothyroidism, unspecified: Secondary | ICD-10-CM | POA: Diagnosis not present

## 2020-07-03 DIAGNOSIS — Z23 Encounter for immunization: Secondary | ICD-10-CM | POA: Diagnosis not present

## 2020-07-18 DIAGNOSIS — L814 Other melanin hyperpigmentation: Secondary | ICD-10-CM | POA: Diagnosis not present

## 2020-07-18 DIAGNOSIS — D225 Melanocytic nevi of trunk: Secondary | ICD-10-CM | POA: Diagnosis not present

## 2020-07-18 DIAGNOSIS — L820 Inflamed seborrheic keratosis: Secondary | ICD-10-CM | POA: Diagnosis not present

## 2020-07-18 DIAGNOSIS — L821 Other seborrheic keratosis: Secondary | ICD-10-CM | POA: Diagnosis not present

## 2020-08-15 DIAGNOSIS — Z125 Encounter for screening for malignant neoplasm of prostate: Secondary | ICD-10-CM | POA: Diagnosis not present

## 2020-08-15 DIAGNOSIS — R972 Elevated prostate specific antigen [PSA]: Secondary | ICD-10-CM | POA: Diagnosis not present

## 2020-08-19 ENCOUNTER — Other Ambulatory Visit: Payer: Self-pay | Admitting: Urology

## 2020-08-19 DIAGNOSIS — R972 Elevated prostate specific antigen [PSA]: Secondary | ICD-10-CM

## 2020-09-09 ENCOUNTER — Other Ambulatory Visit: Payer: Self-pay

## 2020-09-11 ENCOUNTER — Other Ambulatory Visit: Payer: Self-pay

## 2020-09-11 ENCOUNTER — Ambulatory Visit
Admission: RE | Admit: 2020-09-11 | Discharge: 2020-09-11 | Disposition: A | Discharge status: Home / Self Care | Payer: BC Managed Care – PPO | Source: Ambulatory Visit | Attending: Urology | Admitting: Urology

## 2020-09-11 DIAGNOSIS — R972 Elevated prostate specific antigen [PSA]: Secondary | ICD-10-CM

## 2020-09-11 DIAGNOSIS — N401 Enlarged prostate with lower urinary tract symptoms: Secondary | ICD-10-CM | POA: Diagnosis not present

## 2020-09-11 MED ORDER — GADOBENATE DIMEGLUMINE 529 MG/ML IV SOLN
19.0000 mL | Freq: Once | INTRAVENOUS | Status: AC | PRN
  Administered 2020-09-11: 19 mL via INTRAVENOUS

## 2020-09-16 DIAGNOSIS — R972 Elevated prostate specific antigen [PSA]: Secondary | ICD-10-CM | POA: Diagnosis not present

## 2020-09-17 DIAGNOSIS — E785 Hyperlipidemia, unspecified: Secondary | ICD-10-CM | POA: Diagnosis not present

## 2020-09-17 DIAGNOSIS — R7303 Prediabetes: Secondary | ICD-10-CM | POA: Diagnosis not present

## 2020-09-17 DIAGNOSIS — E039 Hypothyroidism, unspecified: Secondary | ICD-10-CM | POA: Diagnosis not present

## 2020-09-17 DIAGNOSIS — Z23 Encounter for immunization: Secondary | ICD-10-CM | POA: Diagnosis not present

## 2020-09-25 DIAGNOSIS — E785 Hyperlipidemia, unspecified: Secondary | ICD-10-CM | POA: Diagnosis not present

## 2020-09-25 DIAGNOSIS — R7303 Prediabetes: Secondary | ICD-10-CM | POA: Diagnosis not present

## 2020-10-16 DIAGNOSIS — H2513 Age-related nuclear cataract, bilateral: Secondary | ICD-10-CM | POA: Diagnosis not present

## 2020-10-28 DIAGNOSIS — R972 Elevated prostate specific antigen [PSA]: Secondary | ICD-10-CM | POA: Diagnosis not present

## 2021-01-21 DIAGNOSIS — E785 Hyperlipidemia, unspecified: Secondary | ICD-10-CM | POA: Diagnosis not present

## 2021-01-21 DIAGNOSIS — E559 Vitamin D deficiency, unspecified: Secondary | ICD-10-CM | POA: Diagnosis not present

## 2021-01-21 DIAGNOSIS — E039 Hypothyroidism, unspecified: Secondary | ICD-10-CM | POA: Diagnosis not present

## 2021-01-21 DIAGNOSIS — R7303 Prediabetes: Secondary | ICD-10-CM | POA: Diagnosis not present

## 2021-01-22 DIAGNOSIS — E785 Hyperlipidemia, unspecified: Secondary | ICD-10-CM | POA: Diagnosis not present

## 2021-01-22 DIAGNOSIS — E559 Vitamin D deficiency, unspecified: Secondary | ICD-10-CM | POA: Diagnosis not present

## 2021-01-22 DIAGNOSIS — R7303 Prediabetes: Secondary | ICD-10-CM | POA: Diagnosis not present

## 2021-01-22 DIAGNOSIS — E039 Hypothyroidism, unspecified: Secondary | ICD-10-CM | POA: Diagnosis not present

## 2021-02-06 DIAGNOSIS — R030 Elevated blood-pressure reading, without diagnosis of hypertension: Secondary | ICD-10-CM | POA: Diagnosis not present

## 2021-02-12 ENCOUNTER — Encounter: Payer: Self-pay | Admitting: Gastroenterology

## 2021-02-13 ENCOUNTER — Encounter: Payer: Self-pay | Admitting: Gastroenterology

## 2021-02-18 DIAGNOSIS — M9902 Segmental and somatic dysfunction of thoracic region: Secondary | ICD-10-CM | POA: Diagnosis not present

## 2021-02-18 DIAGNOSIS — M9904 Segmental and somatic dysfunction of sacral region: Secondary | ICD-10-CM | POA: Diagnosis not present

## 2021-02-18 DIAGNOSIS — M9903 Segmental and somatic dysfunction of lumbar region: Secondary | ICD-10-CM | POA: Diagnosis not present

## 2021-02-18 DIAGNOSIS — M5416 Radiculopathy, lumbar region: Secondary | ICD-10-CM | POA: Diagnosis not present

## 2021-02-21 DIAGNOSIS — M9904 Segmental and somatic dysfunction of sacral region: Secondary | ICD-10-CM | POA: Diagnosis not present

## 2021-02-21 DIAGNOSIS — M5416 Radiculopathy, lumbar region: Secondary | ICD-10-CM | POA: Diagnosis not present

## 2021-02-21 DIAGNOSIS — M9903 Segmental and somatic dysfunction of lumbar region: Secondary | ICD-10-CM | POA: Diagnosis not present

## 2021-02-21 DIAGNOSIS — M9902 Segmental and somatic dysfunction of thoracic region: Secondary | ICD-10-CM | POA: Diagnosis not present

## 2021-04-09 ENCOUNTER — Ambulatory Visit: Payer: BC Managed Care – PPO | Admitting: Gastroenterology

## 2021-04-23 DIAGNOSIS — Z125 Encounter for screening for malignant neoplasm of prostate: Secondary | ICD-10-CM | POA: Diagnosis not present

## 2021-04-28 DIAGNOSIS — R972 Elevated prostate specific antigen [PSA]: Secondary | ICD-10-CM | POA: Diagnosis not present

## 2021-06-18 DIAGNOSIS — Z1211 Encounter for screening for malignant neoplasm of colon: Secondary | ICD-10-CM | POA: Diagnosis not present

## 2021-06-18 DIAGNOSIS — K648 Other hemorrhoids: Secondary | ICD-10-CM | POA: Diagnosis not present

## 2021-06-18 DIAGNOSIS — K635 Polyp of colon: Secondary | ICD-10-CM | POA: Diagnosis not present

## 2021-06-18 DIAGNOSIS — K644 Residual hemorrhoidal skin tags: Secondary | ICD-10-CM | POA: Diagnosis not present

## 2021-06-18 DIAGNOSIS — Z86010 Personal history of colonic polyps: Secondary | ICD-10-CM | POA: Diagnosis not present

## 2021-06-18 DIAGNOSIS — Z980 Intestinal bypass and anastomosis status: Secondary | ICD-10-CM | POA: Diagnosis not present

## 2021-07-17 DIAGNOSIS — L570 Actinic keratosis: Secondary | ICD-10-CM | POA: Diagnosis not present

## 2021-07-17 DIAGNOSIS — L814 Other melanin hyperpigmentation: Secondary | ICD-10-CM | POA: Diagnosis not present

## 2021-07-17 DIAGNOSIS — D225 Melanocytic nevi of trunk: Secondary | ICD-10-CM | POA: Diagnosis not present

## 2021-07-17 DIAGNOSIS — L821 Other seborrheic keratosis: Secondary | ICD-10-CM | POA: Diagnosis not present

## 2021-08-06 DIAGNOSIS — K648 Other hemorrhoids: Secondary | ICD-10-CM | POA: Diagnosis not present

## 2021-10-08 DIAGNOSIS — Z125 Encounter for screening for malignant neoplasm of prostate: Secondary | ICD-10-CM | POA: Diagnosis not present

## 2021-10-14 DIAGNOSIS — R972 Elevated prostate specific antigen [PSA]: Secondary | ICD-10-CM | POA: Diagnosis not present

## 2023-03-10 ENCOUNTER — Other Ambulatory Visit (INDEPENDENT_AMBULATORY_CARE_PROVIDER_SITE_OTHER): Payer: Managed Care, Other (non HMO)

## 2023-03-10 ENCOUNTER — Ambulatory Visit (INDEPENDENT_AMBULATORY_CARE_PROVIDER_SITE_OTHER): Payer: Managed Care, Other (non HMO) | Admitting: Orthopaedic Surgery

## 2023-03-10 DIAGNOSIS — G8929 Other chronic pain: Secondary | ICD-10-CM

## 2023-03-10 DIAGNOSIS — M25562 Pain in left knee: Secondary | ICD-10-CM | POA: Diagnosis not present

## 2023-03-10 DIAGNOSIS — M25561 Pain in right knee: Secondary | ICD-10-CM

## 2023-03-10 NOTE — Progress Notes (Signed)
The patient is a 62 year old gentleman I am seeing for the first time for chronic knee pain with the left worse than right.  He says that if he has been sitting for a while and goes to stand up he does have some pain in that left knee and is certainly worse in the morning with stiffness in his knees.  He is never had surgery in his knees and never had any type of injuries that he is aware of.  He is always been very active.  He is not obese.  He is not a diabetic.  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  He has never had any type of injections in his knees either.  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  He denies any symptoms of locking catching or instability.  Examination of both knees shows no effusion.  Both knees have excellent and full range of motion.  Both knees do show some irritation of the medial joint line with rotating the tibia on the femur.  Both knees have slight patellofemoral crepitation but are ligamentously stable.  X-rays of both knees show some mild patellofemoral narrowing.  Both knees have neutral alignment in the medial and lateral compartments are well-maintained.  I did talk to him about quad strengthening exercises.  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  He is also requesting this and I absolutely feel this is reasonable to try to order for him.  All questions and concerns were answered and addressed.

## 2023-03-11 ENCOUNTER — Telehealth: Payer: Self-pay

## 2023-03-11 NOTE — Telephone Encounter (Signed)
VOB submitted for Durolane, bilateral knee  

## 2023-03-11 NOTE — Telephone Encounter (Signed)
Bilateral knee gel injection  

## 2023-03-24 ENCOUNTER — Telehealth: Payer: Self-pay

## 2023-03-24 NOTE — Telephone Encounter (Signed)
Faxed completed PA form to [facility] for Durolane, bilateral knee to [phone]. PA Pending

## 2023-04-14 ENCOUNTER — Telehealth: Payer: Self-pay | Admitting: Orthopaedic Surgery

## 2023-04-14 ENCOUNTER — Other Ambulatory Visit: Payer: Self-pay

## 2023-04-14 DIAGNOSIS — G8929 Other chronic pain: Secondary | ICD-10-CM

## 2023-04-14 NOTE — Telephone Encounter (Signed)
Talked with patient and appointment has been scheduled for gel injection.  

## 2023-04-14 NOTE — Telephone Encounter (Signed)
Pt called about update of approval for gel injection. Please call pt at [phone]

## 2023-04-22 ENCOUNTER — Encounter: Payer: Self-pay | Admitting: Orthopaedic Surgery

## 2023-04-22 ENCOUNTER — Ambulatory Visit: Payer: Managed Care, Other (non HMO) | Admitting: Orthopaedic Surgery

## 2023-04-22 DIAGNOSIS — M170 Bilateral primary osteoarthritis of knee: Secondary | ICD-10-CM

## 2023-04-22 DIAGNOSIS — M1711 Unilateral primary osteoarthritis, right knee: Secondary | ICD-10-CM

## 2023-04-22 DIAGNOSIS — M1712 Unilateral primary osteoarthritis, left knee: Secondary | ICD-10-CM

## 2023-04-22 MED ORDER — SODIUM HYALURONATE 60 MG/3ML IX PRSY
60.0000 mg | PREFILLED_SYRINGE | INTRA_ARTICULAR | Status: AC | PRN
  Administered 2023-04-22: 60 mg via INTRA_ARTICULAR

## 2023-04-22 NOTE — Progress Notes (Signed)
   Procedure Note  Patient: Randy Pham             Date of Birth: 1961/06/25           MRN: [id number]             Visit Date: 04/22/2023  Procedures: Visit Diagnoses:  1. Unilateral primary osteoarthritis, left knee   2. Unilateral primary osteoarthritis, right knee     Large Joint Inj: R knee on 04/22/2023 3:45 PM Indications: diagnostic evaluation and pain Details: 22 G 1.5 in needle, superolateral approach  Arthrogram: No  Medications: 60 mg Sodium Hyaluronate 60 MG/3ML Outcome: tolerated well, no immediate complications Procedure, treatment alternatives, risks and benefits explained, specific risks discussed. Consent was given by the patient. Immediately prior to procedure a time out was called to verify the correct patient, procedure, equipment, support staff and site/side marked as required. Patient was prepped and draped in the usual sterile fashion.    Large Joint Inj: L knee on 04/22/2023 3:45 PM Indications: diagnostic evaluation and pain Details: 22 G 1.5 in needle, superolateral approach  Arthrogram: No  Medications: 60 mg Sodium Hyaluronate 60 MG/3ML Outcome: tolerated well, no immediate complications Procedure, treatment alternatives, risks and benefits explained, specific risks discussed. Consent was given by the patient. Immediately prior to procedure a time out was called to verify the correct patient, procedure, equipment, support staff and site/side marked as required. Patient was prepped and draped in the usual sterile fashion.    The patient is here today to have hyaluronic acid in both knees to treat the pain from osteoarthritis.  He is 61 years old.  The injections today are with dural Maurice March.  He has never had these before but he has had other conservative treatment measures.  He is active and tries to stay active.  Examination of both knees shows slight varus malalignment.  There is no effusion with either knee.  Both knees have good range of  motion.  I did place Durolane in both knees today which she tolerated well.  All questions concerns were answered and addressed.  He knows the earliest he can get these again in 6 months.  He can always get a steroid injection in 3 months from now.  He understands this can take about 4 to 6 weeks before it has any potential for having an effect on his knees.  Lot #[id number]

## 2023-04-23 ENCOUNTER — Encounter: Payer: Self-pay | Admitting: Orthopaedic Surgery

## 2023-09-24 ENCOUNTER — Encounter (HOSPITAL_COMMUNITY): Payer: Self-pay

## 2023-09-24 ENCOUNTER — Ambulatory Visit (HOSPITAL_COMMUNITY): Admission: EM | Admit: 2023-09-24 | Discharge: 2023-09-24 | Disposition: A | Discharge status: Home / Self Care

## 2023-09-24 DIAGNOSIS — M62830 Muscle spasm of back: Secondary | ICD-10-CM

## 2023-09-24 MED ORDER — METHOCARBAMOL 500 MG PO TABS: 500.0000 mg | ORAL_TABLET | Freq: Two times a day (BID) | ORAL | 0 refills | Status: AC

## 2023-09-24 NOTE — ED Provider Notes (Signed)
 [facility]    CSN: [id number] Arrival date & time: 09/24/23  1147      History   Chief Complaint Chief Complaint  Patient presents with   Flank Pain    HPI Randy Pham is a 63 y.o. male.   Patient presents with left-sided back pain that radiates around to his side that began on 3/12.  Patient states the pain is triggered by certain movements such as reaching down to tie his shoe or reaching across his body.  Patient states that he was seen by occupational health at work and was given a couple pills of Robaxin which he has been taking with some relief.  Patient states that he has also been taking Tylenol and ibuprofen with some relief.  Denies any recent falls or known injury.  Denies any urinary symptoms.   Flank Pain    Past Medical History:  Diagnosis Date   Hyperlipidemia    Hypothyroidism, secondary    S/P radiation therapy 07/15/09 - 08/30/09   Tonsillar Cancer : 79 Gy, 59.4 Gy and 54 Gy in 33 Fractions   Status post chemotherapy comp. 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 No pertinent surgical history.     Home Medications    Prior to Admission medications   Medication Sig Start Date End Date Taking? Authorizing Provider  levothyroxine (SYNTHROID) 88 MCG tablet Take 88 mcg by mouth every morning. 08/26/23   [provider]  methocarbamol (ROBAXIN) 500 MG tablet Take 1 tablet (500 mg total) by mouth 2 (two) times daily. 09/24/23  Yes Susann Givens, Beckam Abdulaziz A, NP  amLODipine (NORVASC) 2.5 MG tablet Take 2.5 mg by mouth daily.   Yes [provider]  DHA-EPA-Vitamin E (OMEGA-3 COMPLEX PO) 1 capsule   Yes [provider]  Ergocalciferol (VITAMIN D2 PO) 3 (three) times  a week.   Yes [provider]  levothyroxine (SYNTHROID) 75 MCG tablet Take 75 mcg by mouth every morning.   Yes [provider]  omeprazole (PRILOSEC) 20 MG capsule Take 20 mg by mouth daily.   Yes [provider]  rosuvastatin (CRESTOR) 20 MG tablet Take 20 mg by mouth daily.   Yes [provider]    Family History Family History  Problem Relation Age of Onset   Lung cancer Mother    Emphysema Father     Social History Social History   Tobacco Use   Smoking status: Former    Current packs/day: 1.50    Average packs/day: 1.5 packs/day for 35.0 years (52.5 ttl pk-yrs)    Types: Cigarettes  Substance Use Topics   Alcohol use: No     Allergies   Metronidazole hcl   Review of Systems Review of Systems  Genitourinary:  Positive for flank pain.   Per HPI  Physical Exam Triage Vital Signs ED Triage Vitals  Encounter Vitals Group     BP 09/24/23 1210 (!) 157/82     Systolic BP Percentile --      Diastolic BP Percentile --      Pulse Rate 09/24/23 1210 (!) 55     Resp 09/24/23 1210 16  Temp 09/24/23 1210 97.7 F (36.5 C)     Temp Source 09/24/23 1210 Oral     SpO2 09/24/23 1210 95 %     Weight 09/24/23 1209 200 lb (90.7 kg)     Height 09/24/23 1209 5\' 9"  (1.753 m)     Head Circumference --      Peak Flow --      Pain Score 09/24/23 1206 10     Pain Loc --      Pain Education --      Exclude from Growth Chart --    No data found.  Updated Vital Signs BP (!) 157/82 (BP Location: Right Arm)   Pulse (!) 55   Temp 97.7 F (36.5 C) (Oral)   Resp 16   Ht 5\' 9"  (1.753 m)   Wt 200 lb (90.7 kg)   SpO2 95%   BMI 29.53 kg/m   Visual Acuity Right Eye Distance:   Left Eye Distance:   Bilateral Distance:    Right Eye Near:   Left Eye Near:    Bilateral Near:     Physical Exam Vitals and nursing note reviewed.  Constitutional:      General: He is awake. He is not in acute distress.    Appearance: Normal appearance. He  is well-developed and well-groomed. He is not ill-appearing.  Musculoskeletal:     Cervical back: Normal.     Thoracic back: Spasms present.     Lumbar back: Normal.  Neurological:     Mental Status: He is alert.  Psychiatric:        Behavior: Behavior is cooperative.      UC Treatments / Results  Labs (all labs ordered are listed, but only abnormal results are displayed) Labs Reviewed - No data to display  EKG   Radiology No results found.  Procedures Procedures (including critical care time)  Medications Ordered in UC Medications - No data to display  Initial Impression / Assessment and Plan / UC Course  I have reviewed the triage vital signs and the nursing notes.  Pertinent labs & imaging results that were available during my care of the patient were reviewed by me and considered in my medical decision making (see chart for details).     Upon assessment patient is nontender to back and side upon palpation.  Patient denies pain while sitting during exam.  Upon patient reaching across his body he endorses shooting pain from the left side of his back around to his side.  No other significant findings upon exam.  Exam consistent with intermittent spasms of thoracic back muscle.  Prescribed Robaxin as needed for muscle pain and spasms.  Recommended to continue to alternate between Tylenol and ibuprofen for pain.  Given orthopedic follow-up if pain persist.  Discussed return precautions. Final Clinical Impressions(s) / UC Diagnoses   Final diagnoses:  Spasm of thoracic back muscle     Discharge Instructions      I have prescribed Robaxin that you can take twice daily as needed for muscle pain and spasms.  This is a muscle relaxer and can make you drowsy so do not drive, work, or drink alcohol while taking.  Otherwise recommend alternating between Tylenol and ibuprofen every 4-6 hours as needed for pain.  You can also continue to apply heat and do some gentle  stretching to assist with pain.  If your pain continues I have attached [facility] that you can follow-up with with a care provider for further evaluation and  management.  Return here as needed.   ED Prescriptions     Medication Sig Dispense Auth. Provider   methocarbamol (ROBAXIN) 500 MG tablet Take 1 tablet (500 mg total) by mouth 2 (two) times daily. 20 tablet Wynonia Lawman A, NP      PDMP not reviewed this encounter.   Wynonia Lawman A, NP 09/24/23 1323

## 2023-09-24 NOTE — Discharge Instructions (Signed)
 I have prescribed Robaxin that you can take twice daily as needed for muscle pain and spasms.  This is a muscle relaxer and can make you drowsy so do not drive, work, or drink alcohol while taking.  Otherwise recommend alternating between Tylenol and ibuprofen every 4-6 hours as needed for pain.  You can also continue to apply heat and do some gentle stretching to assist with pain.  If your pain continues I have attached [facility] that you can follow-up with with a care provider for further evaluation and management.  Return here as needed.

## 2023-09-24 NOTE — ED Triage Notes (Signed)
 Patient presenting with left flank pain onset Late Wednesday afternoon.  States Tuesday played a few sports but no known injuries or falls. Patient denies any urinary symptoms but having slight back pain as well.  Prescriptions or OTC medications tried: Yes- muscle relaxer, Ibuprofen     with mild relief
